# Patient Record
Sex: Female | Born: 1970
Health system: Southern US, Community
[De-identification: ages and names within clinical notes are randomized; demographics above are authoritative.]

## PROBLEM LIST (undated history)

## (undated) DIAGNOSIS — E669 Obesity, unspecified: Secondary | ICD-10-CM

## (undated) DIAGNOSIS — I10 Essential (primary) hypertension: Secondary | ICD-10-CM

## (undated) DIAGNOSIS — E785 Hyperlipidemia, unspecified: Secondary | ICD-10-CM

## (undated) HISTORY — DX: Hyperlipidemia, unspecified: E78.5

## (undated) HISTORY — DX: Obesity, unspecified: E66.9

---

## 2009-12-14 ENCOUNTER — Ambulatory Visit (HOSPITAL_COMMUNITY): Admission: RE | Admit: 2009-12-14 | Discharge: 2009-12-14 | Payer: Self-pay | Admitting: Pulmonary Disease

## 2012-12-14 ENCOUNTER — Telehealth: Payer: Self-pay | Admitting: Nurse Practitioner

## 2012-12-14 ENCOUNTER — Other Ambulatory Visit: Payer: Self-pay | Admitting: Nurse Practitioner

## 2012-12-14 MED ORDER — ATORVASTATIN CALCIUM 40 MG PO TABS: 40.0000 mg | ORAL_TABLET | Freq: Every day | ORAL | Status: DC

## 2012-12-14 NOTE — Progress Notes (Signed)
RX CALLED INTO PHARMACY

## 2012-12-14 NOTE — Telephone Encounter (Signed)
Spoke with pt and medication was called into pharmacy

## 2013-01-15 ENCOUNTER — Encounter: Payer: Self-pay | Admitting: Nurse Practitioner

## 2013-01-15 ENCOUNTER — Ambulatory Visit (INDEPENDENT_AMBULATORY_CARE_PROVIDER_SITE_OTHER): Payer: Managed Care, Other (non HMO) | Admitting: Nurse Practitioner

## 2013-01-15 VITALS — BP 144/88 | HR 78 | Temp 97.0°F | Ht 64.0 in | Wt 302.0 lb

## 2013-01-15 DIAGNOSIS — E785 Hyperlipidemia, unspecified: Secondary | ICD-10-CM

## 2013-01-15 LAB — COMPLETE METABOLIC PANEL WITH GFR
ALT: <8 U/L (ref 0–35)
Albumin: 3.9 g/dL (ref 3.5–5.2)
Alkaline Phosphatase: 91 U/L (ref 39–117)
BUN: 12 mg/dL (ref 6–23)
Calcium: 9.3 mg/dL (ref 8.4–10.5)
GFR, Est Non African American: >89 mL/min
Sodium: 138 mEq/L (ref 135–145)
Total Bilirubin: 0.8 mg/dL (ref 0.3–1.2)
Total Protein: 7.3 g/dL (ref 6.0–8.3)

## 2013-01-15 MED ORDER — PHENTERMINE-TOPIRAMATE ER 3.75-23 MG PO CP24: 1.0000 | ORAL_CAPSULE | Freq: Every day | ORAL | Status: DC

## 2013-01-15 MED ORDER — PHENTERMINE-TOPIRAMATE ER 7.5-46 MG PO CP24: 1.0000 | ORAL_CAPSULE | Freq: Every day | ORAL | Status: DC

## 2013-01-15 MED ORDER — LORCASERIN HCL 10 MG PO TABS: 10.0000 mg | ORAL_TABLET | Freq: Two times a day (BID) | ORAL | Status: DC

## 2013-01-15 NOTE — Patient Instructions (Signed)
Calorie Counting Diet A calorie counting diet requires you to eat the number of calories that are right for you in a day. Calories are the measurement of how much energy you get from the food you eat. Eating the right amount of calories is important for staying at a healthy weight. If you eat too many calories, your body will store them as fat and you may gain weight. If you eat too few calories, you may lose weight. Counting the number of calories you eat during a day will help you know if you are eating the right amount. A Registered Dietitian can determine how many calories you need in a day. The amount of calories needed varies from person to person. If your goal is to lose weight, you will need to eat fewer calories. Losing weight can benefit you if you are overweight or have health problems such as heart disease, high blood pressure, or diabetes. If your goal is to gain weight, you will need to eat more calories. Gaining weight may be necessary if you have a certain health problem that causes your body to need more energy. TIPS Whether you are increasing or decreasing the number of calories you eat during a day, it may be hard to get used to changes in what you eat and drink. The following are tips to help you keep track of the number of calories you eat.  Measure foods at home with measuring cups. This helps you know the amount of food and number of calories you are eating.  Restaurants often serve food in amounts that are larger than 1 serving. While eating out, estimate how many servings of a food you are given. For example, a serving of cooked rice is  cup or about the size of half of a fist. Knowing serving sizes will help you be aware of how much food you are eating at restaurants.  Ask for smaller portion sizes or child-size portions at restaurants.  Plan to eat half of a meal at a restaurant. Take the rest home or share the other half with a friend.  Read the Nutrition Facts panel on  food labels for calorie content and serving size. You can find out how many servings are in a package, the size of a serving, and the number of calories each serving has.  For example, a package might contain 3 cookies. The Nutrition Facts panel on that package says that 1 serving is 1 cookie. Below that, it will say there are 3 servings in the container. The calories section of the Nutrition Facts label says there are 90 calories. This means there are 90 calories in 1 cookie (1 serving). If you eat 1 cookie you have eaten 90 calories. If you eat all 3 cookies, you have eaten 270 calories (3 servings x 90 calories = 270 calories). The list below tells you how big or small some common portion sizes are.  1 oz.........4 stacked dice.  3 oz.........Deck of cards.  1 tsp........Tip of little finger.  1 tbs........Thumb.  2 tbs........Golf ball.   cup.......Half of a fist.  1 cup........A fist. KEEP A FOOD LOG Write down every food item you eat, the amount you eat, and the number of calories in each food you eat during the day. At the end of the day, you can add up the total number of calories you have eaten. It may help to keep a list like the one below. Find out the calorie information by reading the   Nutrition Facts panel on food labels. Breakfast  Bran cereal (1 cup, 110 calories).  Fat-free milk ( cup, 45 calories). Snack  Apple (1 medium, 80 calories). Lunch  Spinach (1 cup, 20 calories).  Tomato ( medium, 20 calories).  Chicken breast strips (3 oz, 165 calories).  Shredded cheddar cheese ( cup, 110 calories).  Light Italian dressing (2 tbs, 60 calories).  Whole-wheat bread (1 slice, 80 calories).  Tub margarine (1 tsp, 35 calories).  Vegetable soup (1 cup, 160 calories). Dinner  Pork chop (3 oz, 190 calories).  Brown rice (1 cup, 215 calories).  Steamed broccoli ( cup, 20 calories).  Strawberries (1  cup, 65 calories).  Whipped cream (1 tbs, 50  calories). Daily Calorie Total: 1425 Document Released: 08/29/2005 Document Revised: 11/21/2011 Document Reviewed: 02/23/2007 [employer] Patient Information 2013 [employer].  

## 2013-01-15 NOTE — Progress Notes (Signed)
  Subjective:    Patient ID: Madison Butler, female    DOB: 1970-10-04, 42 y.o.   MRN: [id number]  Hyperlipidemia This is a chronic problem. The current episode started more than 1 year ago. The problem is uncontrolled. Recent lipid tests were reviewed and are normal. There are no known factors aggravating her hyperlipidemia. Pertinent negatives include no focal weakness, leg pain, myalgias or shortness of breath. She is currently on no antihyperlipidemic treatment. Improvement on treatment: this will be the first time blood work has been done since starting meds. Compliance problems include adherence to diet and adherence to exercise.  Risk factors for coronary artery disease include obesity.  Obesity Patient was given Rx for Qsymia at last visit but was unable to fill. Wants new RX to try.    Review of Systems  Respiratory: Negative for shortness of breath.   Musculoskeletal: Negative for myalgias.  Neurological: Negative for focal weakness.  All other systems reviewed and are negative.       Objective:   Physical Exam  Constitutional: She is oriented to person, place, and time. She appears well-developed and well-nourished.  HENT:  Nose: Nose normal.  Mouth/Throat: Oropharynx is clear and moist.  Eyes: EOM are normal.  Neck: Trachea normal, normal range of motion and full passive range of motion without pain. Neck supple. No JVD present. Carotid bruit is not present. No thyromegaly present.  Cardiovascular: Normal rate, regular rhythm, normal heart sounds and intact distal pulses.  Exam reveals no gallop and no friction rub.   No murmur heard. Pulmonary/Chest: Effort normal and breath sounds normal.  Abdominal: Soft. Bowel sounds are normal. She exhibits no distension and no mass. There is no tenderness.  Musculoskeletal: Normal range of motion.  Lymphadenopathy:    She has no cervical adenopathy.  Neurological: She is alert and oriented to person, place, and time. She has normal  reflexes.  Skin: Skin is warm and dry.  Psychiatric: She has a normal mood and affect. Her behavior is normal. Judgment and thought content normal.    BP 144/88  Pulse 78  Temp(Src) 97 F (36.1 C) (Oral)  Ht 5\' 4"  (1.626 m)  Wt 302 lb (136.986 kg)  BMI 51.81 kg/m2  LMP 01/09/2013      Assessment & Plan:  1. Hyperlipidemia Low fat diet and exercise - COMPLETE METABOLIC PANEL WITH GFR - NMR Lipoprofile with Lipids  2. Morbid obesity Low fat diet and exercise Go to [email or web address] .[email or web address] for more information to help with weight loss - Phentermine-Topiramate 3.75-23 MG CP24; Take 1 capsule by mouth daily.  Dispense: 14 capsule; Refill: 0 - Phentermine-Topiramate (QSYMIA) 7.5-46 MG CP24; Take 1 capsule by mouth daily.  Dispense: 30 capsule; Refill: 1  Mary-Margaret Daphine Deutscher, FNP

## 2013-01-17 LAB — NMR LIPOPROFILE WITH LIPIDS
HDL-C: 51 mg/dL (ref >=40)
LDL (calc): 53 mg/dL (ref <100)
LP-IR Score: 37 (ref <=45)
Large HDL-P: 9.9 umol/L (ref >=4.8)
Large VLDL-P: 2.8 nmol/L — ABNORMAL HIGH (ref <=2.7)

## 2013-03-21 ENCOUNTER — Telehealth: Payer: Self-pay | Admitting: Nurse Practitioner

## 2013-03-21 NOTE — Telephone Encounter (Signed)
appt given fot July 30. Mmm is on vacation the week of July 22

## 2013-04-10 ENCOUNTER — Ambulatory Visit (INDEPENDENT_AMBULATORY_CARE_PROVIDER_SITE_OTHER): Payer: Managed Care, Other (non HMO) | Admitting: Nurse Practitioner

## 2013-04-10 ENCOUNTER — Encounter: Payer: Self-pay | Admitting: Nurse Practitioner

## 2013-04-10 VITALS — BP 133/84 | HR 79 | Temp 98.2°F | Ht 64.0 in | Wt 303.0 lb

## 2013-04-10 DIAGNOSIS — M7730 Calcaneal spur, unspecified foot: Secondary | ICD-10-CM

## 2013-04-10 DIAGNOSIS — R635 Abnormal weight gain: Secondary | ICD-10-CM

## 2013-04-10 MED ORDER — PHENTERMINE-TOPIRAMATE ER 7.5-46 MG PO CP24: 1.0000 | ORAL_CAPSULE | Freq: Every day | ORAL | Status: DC

## 2013-04-10 MED ORDER — NABUMETONE 750 MG PO TABS: 750.0000 mg | ORAL_TABLET | Freq: Two times a day (BID) | ORAL | Status: DC

## 2013-04-10 NOTE — Progress Notes (Signed)
  Subjective:    Patient ID: Madison Butler, female    DOB: 12/20/1970, 42 y.o.   MRN: [id number]  HPI 1.Pt here for f/u from taking Qsymia for weight loss. Pt states she has taken it for two month with 8 lbs weight loss. However, pt states she has gained most of that back, because she hasn't taken RX in last two weeks. Pt states she would like to continue taking RX. Pt denies any s/s of side effects from Qsymia.  2.Heel spur and would like a refill on relafen  Review of Systems  All other systems reviewed and are negative.       Objective:   Physical Exam  Constitutional: She is oriented to person, place, and time. She appears well-developed and well-nourished.  Cardiovascular: Normal rate, regular rhythm, normal heart sounds and intact distal pulses.   Pulmonary/Chest: Effort normal and breath sounds normal.  Abdominal: Soft. Bowel sounds are normal. She exhibits no distension. There is no tenderness.  Musculoskeletal: Normal range of motion.  Neurological: She is alert and oriented to person, place, and time.  Skin: Skin is warm and dry.  Psychiatric: She has a normal mood and affect. Her behavior is normal. Judgment and thought content normal.    BP 133/84  Pulse 79  Temp(Src) 98.2 F (36.8 C) (Oral)  Ht 5\' 4"  (1.626 m)  Wt 303 lb (137.44 kg)  BMI 51.98 kg/m2       Assessment & Plan:  1. Morbid obesity Low fat diet and exercise encouraged Recheck weight in 2 months - Phentermine-Topiramate (QSYMIA) 7.5-46 MG CP24; Take 1 capsule by mouth daily.  Dispense: 30 capsule; Refill: 2  2. Heel spur, unspecified laterality relafen 750mg  1 PO BID  Mary-Margaret Daphine Deutscher, FNP

## 2013-04-10 NOTE — Patient Instructions (Addendum)

## 2013-05-07 ENCOUNTER — Ambulatory Visit: Payer: Managed Care, Other (non HMO) | Admitting: Nurse Practitioner

## 2013-05-29 ENCOUNTER — Other Ambulatory Visit: Payer: Self-pay | Admitting: Nurse Practitioner

## 2013-05-29 NOTE — Telephone Encounter (Signed)
LAST LIPIDS 5/14.

## 2013-09-01 ENCOUNTER — Other Ambulatory Visit: Payer: Self-pay | Admitting: Nurse Practitioner

## 2013-11-26 ENCOUNTER — Ambulatory Visit (INDEPENDENT_AMBULATORY_CARE_PROVIDER_SITE_OTHER): Payer: BC Managed Care – PPO | Admitting: Nurse Practitioner

## 2013-11-26 ENCOUNTER — Encounter: Payer: Self-pay | Admitting: Nurse Practitioner

## 2013-11-26 ENCOUNTER — Telehealth: Payer: Self-pay | Admitting: Nurse Practitioner

## 2013-11-26 VITALS — BP 136/83 | HR 76 | Temp 97.5°F | Ht 64.0 in | Wt 316.6 lb

## 2013-11-26 DIAGNOSIS — Z0000 Encounter for general adult medical examination without abnormal findings: Secondary | ICD-10-CM

## 2013-11-26 DIAGNOSIS — Z23 Encounter for immunization: Secondary | ICD-10-CM

## 2013-11-26 LAB — POCT UA - MICROSCOPIC ONLY
CRYSTALS, UR, HPF, POC: NEGATIVE
Casts, Ur, LPF, POC: NEGATIVE
MUCUS UA: NEGATIVE
WBC, UR, HPF, POC: NEGATIVE
Yeast, UA: NEGATIVE

## 2013-11-26 LAB — POCT URINALYSIS DIPSTICK
Bilirubin, UA: NEGATIVE
Glucose, UA: NEGATIVE
Ketones, UA: NEGATIVE
LEUKOCYTES UA: NEGATIVE
NITRITE UA: NEGATIVE
Protein, UA: NEGATIVE
Spec Grav, UA: 1.01
Urobilinogen, UA: NEGATIVE
pH, UA: 7

## 2013-11-26 MED ORDER — PHENTERMINE-TOPIRAMATE ER 3.75-23 MG PO CP24: 1.0000 | ORAL_CAPSULE | Freq: Every day | ORAL | Status: DC

## 2013-11-26 MED ORDER — PHENTERMINE-TOPIRAMATE ER 7.5-46 MG PO CP24: 1.0000 | ORAL_CAPSULE | Freq: Every day | ORAL | Status: DC

## 2013-11-26 NOTE — Progress Notes (Signed)
Subjective:    Patient ID: Madison Butler, female    DOB: Sep 18, 1970, 43 y.o.   MRN: [id number]  HPI Patient presents today for annual exam. Patient is interested in losing weight. Not currently dieting or exercising. Has used Qsymia in the past and loss some but was not disciplined. Does have a plan in place if she were to start medication again. Dentist appt scheduled for May and Eye exam scheduled for April.    Review of Systems  Constitutional: Positive for fatigue.  Gastrointestinal: Negative for diarrhea and constipation.  Genitourinary: Negative for dysuria.  Neurological: Negative for headaches.  Psychiatric/Behavioral: Negative for self-injury.       Objective:   Physical Exam  Constitutional: She is oriented to person, place, and time. She appears well-developed and well-nourished.  HENT:  Head: Normocephalic.  Right Ear: External ear normal.  Left Ear: External ear normal.  Mouth/Throat: Oropharynx is clear and moist.  Eyes: Pupils are equal, round, and reactive to light.  Neck: Normal range of motion. Neck supple.  Cardiovascular: Normal rate, regular rhythm and normal heart sounds.  Exam reveals no gallop and no friction rub.   No murmur heard. Pulmonary/Chest: Effort normal and breath sounds normal.  Abdominal: Bowel sounds are normal.  Musculoskeletal: Normal range of motion.  Neurological: She is alert and oriented to person, place, and time.  Skin: Skin is warm and dry.  Psychiatric: She has a normal mood and affect. Her behavior is normal. Judgment and thought content normal.   BP 136/83  Pulse 76  Temp(Src) 97.5 F (36.4 C) (Oral)  Ht $R'5\' 4"'ar$  (1.626 m)  Wt 316 lb 9.6 oz (143.609 kg)  BMI 54.32 kg/m2  Results for orders placed in visit on 11/26/13  POCT URINALYSIS DIPSTICK      Result Value Ref Range   Color, UA YELLOW     Clarity, UA CLEAR     Glucose, UA NEG     Bilirubin, UA NEG     Ketones, UA NEG     Spec Grav, UA 1.010     Blood, UA TRACE       pH, UA 7.0     Protein, UA NEG     Urobilinogen, UA negative     Nitrite, UA NEG     Leukocytes, UA Negative    POCT UA - MICROSCOPIC ONLY      Result Value Ref Range   WBC, Ur, HPF, POC NEG     RBC, urine, microscopic 5-10     Bacteria, U Microscopic RARE     Mucus, UA NEG     Epithelial cells, urine per micros OCC     Crystals, Ur, HPF, POC NEG     Casts, Ur, LPF, POC NEG     Yeast, UA NEG            Assessment & Plan:   1. Annual physical exam   2. Morbid obesity    Orders Placed This Encounter  Procedures  . CMP14+EGFR  . NMR, lipoprofile  . TSH  . POCT urinalysis dipstick  . POCT UA - Microscopic Only   Meds ordered this encounter  Medications  . Phentermine-Topiramate (QSYMIA) 3.75-23 MG CP24    Sig: Take 1 tablet by mouth daily.    Dispense:  14 capsule    Refill:  0    Order Specific Question:  Supervising Provider    Answer:  Chipper Herb [[id number]]  . Phentermine-Topiramate (QSYMIA) 7.5-46 MG CP24  Sig: Take 1 capsule by mouth daily.    Dispense:  30 capsule    Refill:  2    Order Specific Question:  Supervising Provider    Answer:  Chipper Herb [[id number]]    Follow diet and exercise plan in addition to Qsymia Followup in 1 month for weight check  Mary-Margaret Hassell Done, FNP

## 2013-11-26 NOTE — Patient Instructions (Signed)
Calorie Counting Diet A calorie counting diet requires you to eat the number of calories that are right for you in a day. Calories are the measurement of how much energy you get from the food you eat. Eating the right amount of calories is important for staying at a healthy weight. If you eat too many calories, your body will store them as fat and you may gain weight. If you eat too few calories, you may lose weight. Counting the number of calories you eat during a day will help you know if you are eating the right amount. A Registered Dietitian can determine how many calories you need in a day. The amount of calories needed varies from person to person. If your goal is to lose weight, you will need to eat fewer calories. Losing weight can benefit you if you are overweight or have health problems such as heart disease, high blood pressure, or diabetes. If your goal is to gain weight, you will need to eat more calories. Gaining weight may be necessary if you have a certain health problem that causes your body to need more energy. TIPS Whether you are increasing or decreasing the number of calories you eat during a day, it may be hard to get used to changes in what you eat and drink. The following are tips to help you keep track of the number of calories you eat.  Measure foods at home with measuring cups. This helps you know the amount of food and number of calories you are eating.  Restaurants often serve food in amounts that are larger than 1 serving. While eating out, estimate how many servings of a food you are given. For example, a serving of cooked rice is  cup or about the size of half of a fist. Knowing serving sizes will help you be aware of how much food you are eating at restaurants.  Ask for smaller portion sizes or child-size portions at restaurants.  Plan to eat half of a meal at a restaurant. Take the rest home or share the other half with a friend.  Read the Nutrition Facts panel on  food labels for calorie content and serving size. You can find out how many servings are in a package, the size of a serving, and the number of calories each serving has.  For example, a package might contain 3 cookies. The Nutrition Facts panel on that package says that 1 serving is 1 cookie. Below that, it will say there are 3 servings in the container. The calories section of the Nutrition Facts label says there are 90 calories. This means there are 90 calories in 1 cookie (1 serving). If you eat 1 cookie you have eaten 90 calories. If you eat all 3 cookies, you have eaten 270 calories (3 servings x 90 calories = 270 calories). The list below tells you how big or small some common portion sizes are.  1 oz.........4 stacked dice.  3 oz.........Deck of cards.  1 tsp........Tip of little finger.  1 tbs........Thumb.  2 tbs........Golf ball.   cup.......Half of a fist.  1 cup........A fist. KEEP A FOOD LOG Write down every food item you eat, the amount you eat, and the number of calories in each food you eat during the day. At the end of the day, you can add up the total number of calories you have eaten. It may help to keep a list like the one below. Find out the calorie information by reading the   Nutrition Facts panel on food labels. Breakfast  Bran cereal (1 cup, 110 calories).  Fat-free milk ( cup, 45 calories). Snack  Apple (1 medium, 80 calories). Lunch  Spinach (1 cup, 20 calories).  Tomato ( medium, 20 calories).  Chicken breast strips (3 oz, 165 calories).  Shredded cheddar cheese ( cup, 110 calories).  Light Italian dressing (2 tbs, 60 calories).  Whole-wheat bread (1 slice, 80 calories).  Tub margarine (1 tsp, 35 calories).  Vegetable soup (1 cup, 160 calories). Dinner  Pork chop (3 oz, 190 calories).  Brown rice (1 cup, 215 calories).  Steamed broccoli ( cup, 20 calories).  Strawberries (1  cup, 65 calories).  Whipped cream (1 tbs, 50  calories). Daily Calorie Total: 1425 Document Released: 08/29/2005 Document Revised: 11/21/2011 Document Reviewed: 02/23/2007 [employer] Patient Information 2014 [employer].  

## 2013-11-28 LAB — CMP14+EGFR
A/G RATIO: 1.3 (ref 1.1–2.5)
ALBUMIN: 3.8 g/dL (ref 3.5–5.5)
ALK PHOS: 100 IU/L (ref 39–117)
ALT: 15 IU/L (ref 0–32)
AST: 15 IU/L (ref 0–40)
BUN / CREAT RATIO: 16 (ref 9–23)
BUN: 11 mg/dL (ref 6–24)
CALCIUM: 9.4 mg/dL (ref 8.7–10.2)
CHLORIDE: 100 mmol/L (ref 97–108)
CO2: 23 mmol/L (ref 18–29)
CREATININE: 0.7 mg/dL (ref 0.57–1.00)
GFR, EST AFRICAN AMERICAN: 124 mL/min/{1.73_m2} (ref >59)
GFR, EST NON AFRICAN AMERICAN: 107 mL/min/{1.73_m2} (ref >59)
GLOBULIN, TOTAL: 2.9 g/dL (ref 1.5–4.5)
Glucose: 80 mg/dL (ref 65–99)
POTASSIUM: 5.1 mmol/L (ref 3.5–5.2)
Sodium: 138 mmol/L (ref 134–144)
TOTAL PROTEIN: 6.7 g/dL (ref 6.0–8.5)
Total Bilirubin: 0.6 mg/dL (ref 0.0–1.2)

## 2013-11-28 LAB — NMR, LIPOPROFILE
Cholesterol: 130 mg/dL (ref <200)
HDL CHOLESTEROL BY NMR: 51 mg/dL (ref >=40)
HDL Particle Number: 33.5 umol/L (ref >=30.5)
LDL PARTICLE NUMBER: 935 nmol/L (ref <1000)
LDL SIZE: 19.9 nm — AB (ref >20.5)
LDLC SERPL CALC-MCNC: 55 mg/dL (ref <100)
LP-IR Score: <25 (ref <=45)
Small LDL Particle Number: 701 nmol/L — ABNORMAL HIGH (ref <=527)
Triglycerides by NMR: 121 mg/dL (ref <150)

## 2013-11-28 LAB — TSH: TSH: 2.82 u[IU]/mL (ref 0.450–4.500)

## 2013-12-05 ENCOUNTER — Other Ambulatory Visit: Payer: Self-pay | Admitting: *Deleted

## 2013-12-05 MED ORDER — ATORVASTATIN CALCIUM 40 MG PO TABS: ORAL_TABLET | ORAL | Status: DC

## 2013-12-11 ENCOUNTER — Telehealth: Payer: Self-pay | Admitting: Nurse Practitioner

## 2013-12-11 NOTE — Telephone Encounter (Signed)
Patient has 2 refills on rx i wrote in march

## 2013-12-16 ENCOUNTER — Other Ambulatory Visit: Payer: Self-pay | Admitting: *Deleted

## 2013-12-16 NOTE — Telephone Encounter (Signed)
Has to get prio authorization every 10 days for insurance so it needs prio aruthrizations

## 2013-12-16 NOTE — Telephone Encounter (Signed)
Madison Butler please check into this

## 2013-12-17 ENCOUNTER — Telehealth: Payer: Self-pay | Admitting: *Deleted

## 2013-12-17 NOTE — Telephone Encounter (Signed)
This lady does not need a prior authorization she needs and new script, as that one was only 14 days, can you take care of this for her?  Thanks

## 2013-12-17 NOTE — Telephone Encounter (Signed)
Being worked on by Marlynn Perking. Lawson

## 2013-12-17 NOTE — Telephone Encounter (Signed)
Printed this and gave to Hughes SupplyDonna lawson

## 2013-12-27 NOTE — Telephone Encounter (Signed)
Called Gavin PoundDeborah, unable to reach on telephone.  Called pharmacy and they said Gavin PoundDeborah got script filled for 30 on December 18, 2013.

## 2013-12-31 ENCOUNTER — Ambulatory Visit: Payer: BC Managed Care – PPO | Admitting: Nurse Practitioner

## 2014-01-01 NOTE — Telephone Encounter (Signed)
         Could not get pt on telephone, but called pharmacy and  She picked up her med so she must have found her script.

## 2014-01-27 ENCOUNTER — Ambulatory Visit (INDEPENDENT_AMBULATORY_CARE_PROVIDER_SITE_OTHER): Payer: BC Managed Care – PPO | Admitting: Nurse Practitioner

## 2014-01-27 ENCOUNTER — Encounter: Payer: Self-pay | Admitting: Nurse Practitioner

## 2014-01-27 DIAGNOSIS — R635 Abnormal weight gain: Secondary | ICD-10-CM

## 2014-01-27 MED ORDER — PHENTERMINE-TOPIRAMATE ER 7.5-46 MG PO CP24: 1.0000 | ORAL_CAPSULE | Freq: Every day | ORAL | Status: DC

## 2014-01-27 NOTE — Progress Notes (Signed)
   Subjective:    Patient ID: Madison Butler, female    DOB: 23-Feb-1971, 43 y.o.   MRN: [id number]  HPI Patient here today for 1  Month follow up- Morbid obesity and patient was interested in going back on qsymia- SHe is here today for weight check. Since last visit she has been watching her diet with only a little exercise because she has ben getting cortisone shots in feet and she was told not to walk a lot.    Review of Systems  Constitutional: Negative.   HENT: Negative.   Respiratory: Negative.   Cardiovascular: Negative.   Genitourinary: Negative.   Hematological: Negative.   Psychiatric/Behavioral: Negative.   All other systems reviewed and are negative.      Objective:   Physical Exam  Constitutional: She is oriented to person, place, and time. She appears well-developed and well-nourished.  Weight down 7 lbs   Cardiovascular: Normal rate and normal heart sounds.   Pulmonary/Chest: Effort normal and breath sounds normal.  Neurological: She is alert and oriented to person, place, and time.  Skin: Skin is warm and dry.  Psychiatric: She has a normal mood and affect. Her behavior is normal. Judgment and thought content normal.    BP 126/86  Pulse 94  Temp(Src) 98.9 F (37.2 C) (Oral)  Ht 5\' 4"  (1.626 m)  Wt 309 lb (140.161 kg)  BMI 53.01 kg/m2       Assessment & Plan:   1. Morbid obesity    Meds ordered this encounter  Medications  . Phentermine-Topiramate (QSYMIA) 7.5-46 MG CP24    Sig: Take 1 capsule by mouth daily.    Dispense:  30 capsule    Refill:  2    Order Specific Question:  Supervising Provider    Answer:  Ernestina PennaMOORE, DONALD W [[id number]]   Low fat diet Encouraged to try water aerobics Follow up in 1-2 months  Madison Daphine DeutscherMartin, FNP

## 2014-01-27 NOTE — Patient Instructions (Signed)
Fat and Cholesterol Control Diet  Fat and cholesterol levels in your blood and organs are influenced by your diet. High levels of fat and cholesterol may lead to diseases of the heart, small and large blood vessels, gallbladder, liver, and pancreas.  CONTROLLING FAT AND CHOLESTEROL WITH DIET  Although exercise and lifestyle factors are important, your diet is key. That is because certain foods are known to raise cholesterol and others to lower it. The goal is to balance foods for their effect on cholesterol and more importantly, to replace saturated and trans fat with other types of fat, such as monounsaturated fat, polyunsaturated fat, and omega-3 fatty acids.  On average, a person should consume no more than 15 to 17 g of saturated fat daily. Saturated and trans fats are considered "bad" fats, and they will raise LDL cholesterol. Saturated fats are primarily found in animal products such as meats, butter, and cream. However, that does not mean you need to give up all your favorite foods. Today, there are good tasting, low-fat, low-cholesterol substitutes for most of the things you like to eat. Choose low-fat or nonfat alternatives. Choose round or loin cuts of red meat. These types of cuts are lowest in fat and cholesterol. Chicken (without the skin), fish, veal, and ground turkey breast are great choices. Eliminate fatty meats, such as hot dogs and salami. Even shellfish have little or no saturated fat. Have a 3 oz (85 g) portion when you eat lean meat, poultry, or fish.  Trans fats are also called "partially hydrogenated oils." They are oils that have been scientifically manipulated so that they are solid at room temperature resulting in a longer shelf life and improved taste and texture of foods in which they are added. Trans fats are found in stick margarine, some tub margarines, cookies, crackers, and baked goods.   When baking and cooking, oils are a great substitute for butter. The monounsaturated oils are  especially beneficial since it is believed they lower LDL and raise HDL. The oils you should avoid entirely are saturated tropical oils, such as coconut and palm.   Remember to eat a lot from food groups that are naturally free of saturated and trans fat, including fish, fruit, vegetables, beans, grains (barley, rice, couscous, bulgur wheat), and pasta (without cream sauces).   IDENTIFYING FOODS THAT LOWER FAT AND CHOLESTEROL   Soluble fiber may lower your cholesterol. This type of fiber is found in fruits such as apples, vegetables such as broccoli, potatoes, and carrots, legumes such as beans, peas, and lentils, and grains such as barley. Foods fortified with plant sterols (phytosterol) may also lower cholesterol. You should eat at least 2 g per day of these foods for a cholesterol lowering effect.   Read package labels to identify low-saturated fats, trans fat free, and low-fat foods at the supermarket. Select cheeses that have only 2 to 3 g saturated fat per ounce. Use a heart-healthy tub margarine that is free of trans fats or partially hydrogenated oil. When buying baked goods (cookies, crackers), avoid partially hydrogenated oils. Breads and muffins should be made from whole grains (whole-wheat or whole oat flour, instead of "flour" or "enriched flour"). Buy non-creamy canned soups with reduced salt and no added fats.   FOOD PREPARATION TECHNIQUES   Never deep-fry. If you must fry, either stir-fry, which uses very little fat, or use non-stick cooking sprays. When possible, broil, bake, or roast meats, and steam vegetables. Instead of putting butter or margarine on vegetables, use lemon   and herbs, applesauce, and cinnamon (for squash and sweet potatoes). Use nonfat yogurt, salsa, and low-fat dressings for salads.   LOW-SATURATED FAT / LOW-FAT FOOD SUBSTITUTES  Meats / Saturated Fat (g)  · Avoid: Steak, marbled (3 oz/85 g) / 11 g  · Choose: Steak, lean (3 oz/85 g) / 4 g  · Avoid: Hamburger (3 oz/85 g) / 7  g  · Choose: Hamburger, lean (3 oz/85 g) / 5 g  · Avoid: Ham (3 oz/85 g) / 6 g  · Choose: Ham, lean cut (3 oz/85 g) / 2.4 g  · Avoid: Chicken, with skin, dark meat (3 oz/85 g) / 4 g  · Choose: Chicken, skin removed, dark meat (3 oz/85 g) / 2 g  · Avoid: Chicken, with skin, light meat (3 oz/85 g) / 2.5 g  · Choose: Chicken, skin removed, light meat (3 oz/85 g) / 1 g  Dairy / Saturated Fat (g)  · Avoid: Whole milk (1 cup) / 5 g  · Choose: Low-fat milk, 2% (1 cup) / 3 g  · Choose: Low-fat milk, 1% (1 cup) / 1.5 g  · Choose: Skim milk (1 cup) / 0.3 g  · Avoid: Hard cheese (1 oz/28 g) / 6 g  · Choose: Skim milk cheese (1 oz/28 g) / 2 to 3 g  · Avoid: Cottage cheese, 4% fat (1 cup) / 6.5 g  · Choose: Low-fat cottage cheese, 1% fat (1 cup) / 1.5 g  · Avoid: Ice cream (1 cup) / 9 g  · Choose: Sherbet (1 cup) / 2.5 g  · Choose: Nonfat frozen yogurt (1 cup) / 0.3 g  · Choose: Frozen fruit bar / trace  · Avoid: Whipped cream (1 tbs) / 3.5 g  · Choose: Nondairy whipped topping (1 tbs) / 1 g  Condiments / Saturated Fat (g)  · Avoid: Mayonnaise (1 tbs) / 2 g  · Choose: Low-fat mayonnaise (1 tbs) / 1 g  · Avoid: Butter (1 tbs) / 7 g  · Choose: Extra light margarine (1 tbs) / 1 g  · Avoid: Coconut oil (1 tbs) / 11.8 g  · Choose: Olive oil (1 tbs) / 1.8 g  · Choose: Corn oil (1 tbs) / 1.7 g  · Choose: Safflower oil (1 tbs) / 1.2 g  · Choose: Sunflower oil (1 tbs) / 1.4 g  · Choose: Soybean oil (1 tbs) / 2.4 g  · Choose: Canola oil (1 tbs) / 1 g  Document Released: 08/29/2005 Document Revised: 12/24/2012 Document Reviewed: 02/17/2011  ExitCare® Patient Information ©2014 [employer].

## 2014-03-07 ENCOUNTER — Other Ambulatory Visit: Payer: Self-pay | Admitting: Nurse Practitioner

## 2014-06-08 ENCOUNTER — Other Ambulatory Visit: Payer: Self-pay | Admitting: Nurse Practitioner

## 2014-06-18 ENCOUNTER — Other Ambulatory Visit: Payer: Self-pay | Admitting: Nurse Practitioner

## 2014-06-20 ENCOUNTER — Telehealth: Payer: Self-pay | Admitting: Family Medicine

## 2014-06-20 NOTE — Telephone Encounter (Signed)
Left message on patient's voicemail.

## 2014-06-20 NOTE — Telephone Encounter (Signed)
Already addressed- NTBS for weight check to get new rx

## 2014-06-20 NOTE — Telephone Encounter (Signed)
Left message on voicemail that she needs to schedule an appointment.

## 2014-06-20 NOTE — Telephone Encounter (Signed)
Has to be seen to check weight in order to get refill

## 2014-06-20 NOTE — Telephone Encounter (Signed)
Last seen 01/27/14

## 2014-06-26 ENCOUNTER — Ambulatory Visit: Payer: BC Managed Care – PPO | Admitting: Nurse Practitioner

## 2014-06-30 ENCOUNTER — Ambulatory Visit: Payer: BC Managed Care – PPO | Admitting: Nurse Practitioner

## 2014-07-17 ENCOUNTER — Ambulatory Visit (INDEPENDENT_AMBULATORY_CARE_PROVIDER_SITE_OTHER): Payer: BC Managed Care – PPO | Admitting: Nurse Practitioner

## 2014-07-17 ENCOUNTER — Encounter: Payer: Self-pay | Admitting: Nurse Practitioner

## 2014-07-17 ENCOUNTER — Encounter (INDEPENDENT_AMBULATORY_CARE_PROVIDER_SITE_OTHER): Payer: Self-pay

## 2014-07-17 VITALS — BP 142/90 | HR 89 | Temp 98.3°F | Ht 64.0 in | Wt 302.4 lb

## 2014-07-17 DIAGNOSIS — E785 Hyperlipidemia, unspecified: Secondary | ICD-10-CM

## 2014-07-17 DIAGNOSIS — R635 Abnormal weight gain: Secondary | ICD-10-CM

## 2014-07-17 MED ORDER — ATORVASTATIN CALCIUM 40 MG PO TABS: ORAL_TABLET | ORAL | Status: DC

## 2014-07-17 MED ORDER — PHENTERMINE-TOPIRAMATE ER 7.5-46 MG PO CP24: 1.0000 | ORAL_CAPSULE | Freq: Every day | ORAL | Status: DC

## 2014-07-17 NOTE — Progress Notes (Signed)
   Subjective:    Patient ID: Madison Butler, female    DOB: Jun 06, 1971, 43 y.o.   MRN: [id number]   Patient here today for follow up of chronic medical problems.  Hyperlipidemia This is a chronic problem. The current episode started more than 1 year ago. The problem is uncontrolled. Recent lipid tests were reviewed and are high. Exacerbating diseases include obesity. She has no history of diabetes or hypothyroidism. Current antihyperlipidemic treatment includes statins. The current treatment provides mild improvement of lipids. Compliance problems include adherence to diet and adherence to exercise.  Risk factors for coronary artery disease include obesity and dyslipidemia.  obesity Patient has been on qsymia for weight loss but did not really help. Weight is only down 7 lbs.   Review of Systems  Constitutional: Negative.   HENT: Negative.   Respiratory: Negative.   Cardiovascular: Negative.   Genitourinary: Negative.   Neurological: Negative.   Psychiatric/Behavioral: Negative.   All other systems reviewed and are negative.      Objective:   Physical Exam  Constitutional: She is oriented to person, place, and time. She appears well-developed and well-nourished.  HENT:  Nose: Nose normal.  Mouth/Throat: Oropharynx is clear and moist.  Eyes: EOM are normal.  Neck: Trachea normal, normal range of motion and full passive range of motion without pain. Neck supple. No JVD present. Carotid bruit is not present. No thyromegaly present.  Cardiovascular: Normal rate, regular rhythm, normal heart sounds and intact distal pulses.  Exam reveals no gallop and no friction rub.   No murmur heard. Pulmonary/Chest: Effort normal and breath sounds normal.  Abdominal: Soft. Bowel sounds are normal. She exhibits no distension and no mass. There is no tenderness.  Musculoskeletal: Normal range of motion.  Lymphadenopathy:    She has no cervical adenopathy.  Neurological: She is alert and oriented to  person, place, and time. She has normal reflexes.  Skin: Skin is warm and dry.  Psychiatric: She has a normal mood and affect. Her behavior is normal. Judgment and thought content normal.   BP 142/90 mmHg  Pulse 89  Temp(Src) 98.3 F (36.8 C) (Oral)  Ht $R'5\' 4"'zY$  (1.626 m)  Wt 302 lb 6.4 oz (137.168 kg)  BMI 51.88 kg/m2  LMP         Assessment & Plan:  1. Hyperlipidemia Low fat diet - atorvastatin (LIPITOR) 40 MG tablet; TAKE 1 TABLET BY MOUTH EVERY DAY  Dispense: 90 tablet; Refill: 1 - CMP14+EGFR - NMR, lipoprofile  2. Morbid obesity Back on weight watchers - Phentermine-Topiramate (QSYMIA) 7.5-46 MG CP24; Take 1 capsule by mouth daily.  Dispense: 30 capsule; Refill: 2    Labs pending Health maintenance reviewed Diet and exercise encouraged Continue all meds Follow up  In 6 months   [place], FNP

## 2014-07-17 NOTE — Patient Instructions (Signed)

## 2014-07-18 LAB — NMR, LIPOPROFILE
Cholesterol: 136 mg/dL (ref 100–199)
HDL CHOLESTEROL BY NMR: 59 mg/dL (ref >39)
HDL PARTICLE NUMBER: 31.3 umol/L (ref >=30.5)
LDL PARTICLE NUMBER: 802 nmol/L (ref <1000)
LDL SIZE: 20.3 nm (ref >20.5)
LDL-C: 60 mg/dL (ref 0–99)
Small LDL Particle Number: 469 nmol/L (ref <=527)
Triglycerides by NMR: 86 mg/dL (ref 0–149)

## 2014-07-18 LAB — CMP14+EGFR
ALBUMIN: 3.7 g/dL (ref 3.5–5.5)
ALT: 47 IU/L — AB (ref 0–32)
AST: 35 IU/L (ref 0–40)
Albumin/Globulin Ratio: 1.1 (ref 1.1–2.5)
Alkaline Phosphatase: 97 IU/L (ref 39–117)
BUN / CREAT RATIO: 19 (ref 9–23)
BUN: 12 mg/dL (ref 6–24)
CHLORIDE: 99 mmol/L (ref 97–108)
CO2: 25 mmol/L (ref 18–29)
Calcium: 9.1 mg/dL (ref 8.7–10.2)
Creatinine, Ser: 0.62 mg/dL (ref 0.57–1.00)
GFR calc non Af Amer: 112 mL/min/{1.73_m2} (ref >59)
GFR, EST AFRICAN AMERICAN: 129 mL/min/{1.73_m2} (ref >59)
GLOBULIN, TOTAL: 3.3 g/dL (ref 1.5–4.5)
Glucose: 95 mg/dL (ref 65–99)
POTASSIUM: 4.6 mmol/L (ref 3.5–5.2)
Sodium: 140 mmol/L (ref 134–144)
Total Bilirubin: 0.4 mg/dL (ref 0.0–1.2)
Total Protein: 7 g/dL (ref 6.0–8.5)

## 2014-12-26 ENCOUNTER — Other Ambulatory Visit: Payer: Self-pay | Admitting: Nurse Practitioner

## 2014-12-26 DIAGNOSIS — E785 Hyperlipidemia, unspecified: Secondary | ICD-10-CM

## 2014-12-26 MED ORDER — NABUMETONE 750 MG PO TABS: 750.0000 mg | ORAL_TABLET | Freq: Two times a day (BID) | ORAL | Status: DC

## 2014-12-26 MED ORDER — ATORVASTATIN CALCIUM 40 MG PO TABS
ORAL_TABLET | ORAL | Status: DC
Start: 2014-12-26 — End: 2015-03-22

## 2014-12-26 NOTE — Telephone Encounter (Signed)
done

## 2015-03-20 ENCOUNTER — Encounter: Payer: Self-pay | Admitting: *Deleted

## 2015-03-22 ENCOUNTER — Other Ambulatory Visit: Payer: Self-pay | Admitting: Nurse Practitioner

## 2015-04-21 ENCOUNTER — Encounter: Payer: Self-pay | Admitting: Nurse Practitioner

## 2015-05-28 ENCOUNTER — Encounter: Payer: Self-pay | Admitting: Nurse Practitioner

## 2015-05-28 ENCOUNTER — Ambulatory Visit (INDEPENDENT_AMBULATORY_CARE_PROVIDER_SITE_OTHER): Payer: BLUE CROSS/BLUE SHIELD | Admitting: Nurse Practitioner

## 2015-05-28 ENCOUNTER — Encounter (INDEPENDENT_AMBULATORY_CARE_PROVIDER_SITE_OTHER): Payer: Self-pay

## 2015-05-28 VITALS — BP 128/81 | HR 75 | Temp 97.1°F | Ht 64.0 in | Wt 334.0 lb

## 2015-05-28 DIAGNOSIS — M7730 Calcaneal spur, unspecified foot: Secondary | ICD-10-CM | POA: Diagnosis not present

## 2015-05-28 DIAGNOSIS — R609 Edema, unspecified: Secondary | ICD-10-CM

## 2015-05-28 DIAGNOSIS — Z0000 Encounter for general adult medical examination without abnormal findings: Secondary | ICD-10-CM | POA: Diagnosis not present

## 2015-05-28 DIAGNOSIS — E785 Hyperlipidemia, unspecified: Secondary | ICD-10-CM | POA: Diagnosis not present

## 2015-05-28 DIAGNOSIS — R600 Localized edema: Secondary | ICD-10-CM

## 2015-05-28 MED ORDER — PHENTERMINE HCL 37.5 MG PO TABS: 37.5000 mg | ORAL_TABLET | Freq: Every day | ORAL | Status: DC

## 2015-05-28 MED ORDER — NABUMETONE 750 MG PO TABS
750.0000 mg | ORAL_TABLET | Freq: Two times a day (BID) | ORAL | Status: DC
Start: 2015-05-28 — End: 2015-08-25

## 2015-05-28 MED ORDER — ATORVASTATIN CALCIUM 40 MG PO TABS: 40.0000 mg | ORAL_TABLET | Freq: Every day | ORAL | Status: DC

## 2015-05-28 MED ORDER — FUROSEMIDE 20 MG PO TABS: 20.0000 mg | ORAL_TABLET | Freq: Every day | ORAL | Status: DC | PRN

## 2015-05-28 NOTE — Progress Notes (Signed)
Subjective:    Patient ID: Madison Butler, female    DOB: 06-18-1971, 44 y.o.   MRN: [id number]   Patient here today for complete physical exam no PAP. SHe is doing well without complaints, other tehn she has noticed slight lower ext edema in the last 2 months.  Hyperlipidemia This is a chronic problem. The current episode started more than 1 year ago. The problem is uncontrolled. Recent lipid tests were reviewed and are high. Exacerbating diseases include obesity. She has no history of diabetes or hypothyroidism. Current antihyperlipidemic treatment includes statins. The current treatment provides mild improvement of lipids. Compliance problems include adherence to diet and adherence to exercise.  Risk factors for coronary artery disease include obesity and dyslipidemia.  obesity Patient has been on qsymia for weight loss but did not really help. Weight is only down 7 lbs. Heel spurs relafen makes pain bearable.     Review of Systems  Constitutional: Negative.   HENT: Negative.   Respiratory: Negative.   Cardiovascular: Negative.   Genitourinary: Negative.   Neurological: Negative.   Psychiatric/Behavioral: Negative.   All other systems reviewed and are negative.      Objective:   Physical Exam  Constitutional: She is oriented to person, place, and time. She appears well-developed and well-nourished.  HENT:  Nose: Nose normal.  Mouth/Throat: Oropharynx is clear and moist.  Eyes: EOM are normal.  Neck: Trachea normal, normal range of motion and full passive range of motion without pain. Neck supple. No JVD present. Carotid bruit is not present. No thyromegaly present.  Cardiovascular: Normal rate, regular rhythm, normal heart sounds and intact distal pulses.  Exam reveals no gallop and no friction rub.   No murmur heard. Pulmonary/Chest: Effort normal and breath sounds normal.  Abdominal: Soft. Bowel sounds are normal. She exhibits no distension and no mass. There is no  tenderness.  Musculoskeletal: Normal range of motion.  Lymphadenopathy:    She has no cervical adenopathy.  Neurological: She is alert and oriented to person, place, and time. She has normal reflexes.  Skin: Skin is warm and dry.  Psychiatric: She has a normal mood and affect. Her behavior is normal. Judgment and thought content normal.   BP 128/81 mmHg  Pulse 75  Temp(Src) 97.1 F (36.2 C) (Oral)  Ht _0  (1.626 m)  Wt 334 lb (151.501 kg)  BMI 57.30 kg/m2        Assessment & Plan:  1. Annual physical exam - CBC with Differential/Platelet - Thyroid Panel With TSH - Vit D  25 hydroxy (rtn osteoporosis monitoring)  2. Hyperlipidemia Low fat diet - CMP14+EGFR - Lipid panel - atorvastatin (LIPITOR) 40 MG tablet; Take 1 tablet (40 mg total) by mouth daily.  Dispense: 90 tablet; Refill: 1  3. Morbid obesity Discussed diet and exercise for person with BMI >25 Will recheck weight in 3-6 months Encouraged weight watchers - phentermine (ADIPEX-P) 37.5 MG tablet; Take 1 tablet (37.5 mg total) by mouth daily before breakfast.  Dispense: 30 tablet; Refill: 3  4. Heel spur, unspecified laterality - nabumetone (RELAFEN) 750 MG tablet; Take 1 tablet (750 mg total) by mouth 2 (two) times daily.  Dispense: 180 tablet; Refill: 0  5. Peripheral edema Peripheral edema - furosemide (LASIX) 20 MG tablet; Take 1 tablet (20 mg total) by mouth daily as needed.  Dispense: 30 tablet; Refill: 3    Labs pending Health maintenance reviewed Diet and exercise encouraged Continue all meds Follow up  In 3 month  Mary-Margaret Hassell Done, FNP

## 2015-05-28 NOTE — Patient Instructions (Signed)
Exercise to Stay Healthy Exercise helps you become and stay healthy. EXERCISE IDEAS AND TIPS Choose exercises that:  You enjoy.  Fit into your day. You do not need to exercise really hard to be healthy. You can do exercises at a slow or medium level and stay healthy. You can:  Stretch before and after working out.  Try yoga, Pilates, or tai chi.  Lift weights.  Walk fast, swim, jog, run, climb stairs, bicycle, dance, or rollerskate.  Take aerobic classes. Exercises that burn about 150 calories:  Running 1  miles in 15 minutes.  Playing volleyball for 45 to 60 minutes.  Washing and waxing a car for 45 to 60 minutes.  Playing touch football for 45 minutes.  Walking 1  miles in 35 minutes.  Pushing a stroller 1  miles in 30 minutes.  Playing basketball for 30 minutes.  Raking leaves for 30 minutes.  Bicycling 5 miles in 30 minutes.  Walking 2 miles in 30 minutes.  Dancing for 30 minutes.  Shoveling snow for 15 minutes.  Swimming laps for 20 minutes.  Walking up stairs for 15 minutes.  Bicycling 4 miles in 15 minutes.  Gardening for 30 to 45 minutes.  Jumping rope for 15 minutes.  Washing windows or floors for 45 to 60 minutes. Document Released: 10/01/2010 Document Revised: 11/21/2011 Document Reviewed: 10/01/2010 ExitCare Patient Information 2015 [employer]. This information is not intended to replace advice given to you by your health care provider. Make sure you discuss any questions you have with your health care provider.  

## 2015-05-29 LAB — CMP14+EGFR
A/G RATIO: 1.1 (ref 1.1–2.5)
ALBUMIN: 3.6 g/dL (ref 3.5–5.5)
ALK PHOS: 88 IU/L (ref 39–117)
ALT: 132 IU/L — ABNORMAL HIGH (ref 0–32)
AST: 131 IU/L — ABNORMAL HIGH (ref 0–40)
BILIRUBIN TOTAL: 0.8 mg/dL (ref 0.0–1.2)
BUN / CREAT RATIO: 19 (ref 9–23)
BUN: 12 mg/dL (ref 6–24)
CHLORIDE: 101 mmol/L (ref 97–108)
CO2: 23 mmol/L (ref 18–29)
Calcium: 8.8 mg/dL (ref 8.7–10.2)
Creatinine, Ser: 0.62 mg/dL (ref 0.57–1.00)
GFR calc non Af Amer: 111 mL/min/{1.73_m2} (ref >59)
GFR, EST AFRICAN AMERICAN: 128 mL/min/{1.73_m2} (ref >59)
GLOBULIN, TOTAL: 3.4 g/dL (ref 1.5–4.5)
Glucose: 82 mg/dL (ref 65–99)
Potassium: 4.5 mmol/L (ref 3.5–5.2)
SODIUM: 139 mmol/L (ref 134–144)
TOTAL PROTEIN: 7 g/dL (ref 6.0–8.5)

## 2015-05-29 LAB — CBC WITH DIFFERENTIAL/PLATELET
BASOS ABS: 0 10*3/uL (ref 0.0–0.2)
Basos: 0 %
EOS (ABSOLUTE): 0.2 10*3/uL (ref 0.0–0.4)
EOS: 3 %
HEMATOCRIT: 42.2 % (ref 34.0–46.6)
HEMOGLOBIN: 13.8 g/dL (ref 11.1–15.9)
Immature Grans (Abs): 0 10*3/uL (ref 0.0–0.1)
Immature Granulocytes: 0 %
LYMPHS ABS: 3.2 10*3/uL — AB (ref 0.7–3.1)
Lymphs: 35 %
MCH: 32.5 pg (ref 26.6–33.0)
MCHC: 32.7 g/dL (ref 31.5–35.7)
MCV: 99 fL — ABNORMAL HIGH (ref 79–97)
MONOCYTES: 8 %
MONOS ABS: 0.7 10*3/uL (ref 0.1–0.9)
NEUTROS ABS: 5 10*3/uL (ref 1.4–7.0)
Neutrophils: 54 %
Platelets: 233 10*3/uL (ref 150–379)
RBC: 4.25 x10E6/uL (ref 3.77–5.28)
RDW: 13.3 % (ref 12.3–15.4)
WBC: 9.2 10*3/uL (ref 3.4–10.8)

## 2015-05-29 LAB — LIPID PANEL
CHOL/HDL RATIO: 2 ratio (ref 0.0–4.4)
Cholesterol, Total: 131 mg/dL (ref 100–199)
HDL: 66 mg/dL (ref >39)
LDL CALC: 47 mg/dL (ref 0–99)
TRIGLYCERIDES: 90 mg/dL (ref 0–149)
VLDL Cholesterol Cal: 18 mg/dL (ref 5–40)

## 2015-05-29 LAB — THYROID PANEL WITH TSH
FREE THYROXINE INDEX: 2.3 (ref 1.2–4.9)
T3 UPTAKE RATIO: 22 % — AB (ref 24–39)
T4 TOTAL: 10.6 ug/dL (ref 4.5–12.0)
TSH: 2.74 u[IU]/mL (ref 0.450–4.500)

## 2015-05-29 LAB — VITAMIN D 25 HYDROXY (VIT D DEFICIENCY, FRACTURES): Vit D, 25-Hydroxy: 17.3 ng/mL — ABNORMAL LOW (ref 30.0–100.0)

## 2015-08-25 ENCOUNTER — Other Ambulatory Visit: Payer: Self-pay | Admitting: Nurse Practitioner

## 2016-01-03 ENCOUNTER — Other Ambulatory Visit: Payer: Self-pay | Admitting: Nurse Practitioner

## 2016-01-28 ENCOUNTER — Other Ambulatory Visit: Payer: Self-pay | Admitting: *Deleted

## 2016-01-28 DIAGNOSIS — R609 Edema, unspecified: Secondary | ICD-10-CM

## 2016-01-28 MED ORDER — FUROSEMIDE 20 MG PO TABS: 20.0000 mg | ORAL_TABLET | Freq: Every day | ORAL | Status: DC | PRN

## 2016-01-28 NOTE — Telephone Encounter (Signed)
Pt hasn't been seen since 05/28/2015.

## 2016-03-11 ENCOUNTER — Telehealth: Payer: Self-pay | Admitting: Nurse Practitioner

## 2016-03-11 MED ORDER — FUROSEMIDE 40 MG PO TABS: 40.0000 mg | ORAL_TABLET | Freq: Every day | ORAL | Status: DC

## 2016-03-11 NOTE — Telephone Encounter (Signed)
laix increased to 40mg  and rx sent to pharmacy

## 2016-03-11 NOTE — Telephone Encounter (Signed)
Pt aware Rx sent to pharmacy 

## 2016-03-22 DIAGNOSIS — Z1231 Encounter for screening mammogram for malignant neoplasm of breast: Secondary | ICD-10-CM | POA: Diagnosis not present

## 2016-03-31 ENCOUNTER — Other Ambulatory Visit: Payer: Self-pay | Admitting: Nurse Practitioner

## 2016-05-27 ENCOUNTER — Other Ambulatory Visit: Payer: Self-pay | Admitting: Nurse Practitioner

## 2016-05-27 NOTE — Telephone Encounter (Signed)
Please review and advise.

## 2016-06-07 ENCOUNTER — Encounter: Payer: Self-pay | Admitting: Nurse Practitioner

## 2016-06-07 ENCOUNTER — Ambulatory Visit (INDEPENDENT_AMBULATORY_CARE_PROVIDER_SITE_OTHER): Payer: BLUE CROSS/BLUE SHIELD | Admitting: Nurse Practitioner

## 2016-06-07 VITALS — BP 140/90 | HR 92 | Temp 97.5°F | Ht 64.0 in | Wt 359.0 lb

## 2016-06-07 DIAGNOSIS — E785 Hyperlipidemia, unspecified: Secondary | ICD-10-CM

## 2016-06-07 DIAGNOSIS — I10 Essential (primary) hypertension: Secondary | ICD-10-CM | POA: Insufficient documentation

## 2016-06-07 MED ORDER — ATORVASTATIN CALCIUM 40 MG PO TABS: 40.0000 mg | ORAL_TABLET | Freq: Every day | ORAL | 0 refills | Status: DC

## 2016-06-07 MED ORDER — LISINOPRIL 10 MG PO TABS: 10.0000 mg | ORAL_TABLET | Freq: Every day | ORAL | 1 refills | Status: DC

## 2016-06-07 MED ORDER — FUROSEMIDE 40 MG PO TABS: 40.0000 mg | ORAL_TABLET | Freq: Every day | ORAL | 3 refills | Status: DC

## 2016-06-07 NOTE — Patient Instructions (Signed)
Hypertension Hypertension, commonly called high blood pressure, is when the force of blood pumping through your arteries is too strong. Your arteries are the blood vessels that carry blood from your heart throughout your body. A blood pressure reading consists of a higher number over a lower number, such as 110/72. The higher number (systolic) is the pressure inside your arteries when your heart pumps. The lower number (diastolic) is the pressure inside your arteries when your heart relaxes. Ideally you want your blood pressure below 120/80. Hypertension forces your heart to work harder to pump blood. Your arteries may become narrow or stiff. Having untreated or uncontrolled hypertension can cause heart attack, stroke, kidney disease, and other problems. RISK FACTORS Some risk factors for high blood pressure are controllable. Others are not.  Risk factors you cannot control include:   Race. You may be at higher risk if you are African American.  Age. Risk increases with age.  Gender. Men are at higher risk than women before age 45 years. After age 65, women are at higher risk than men. Risk factors you can control include:  Not getting enough exercise or physical activity.  Being overweight.  Getting too much fat, sugar, calories, or salt in your diet.  Drinking too much alcohol. SIGNS AND SYMPTOMS Hypertension does not usually cause signs or symptoms. Extremely high blood pressure (hypertensive crisis) may cause headache, anxiety, shortness of breath, and nosebleed. DIAGNOSIS To check if you have hypertension, your health care provider will measure your blood pressure while you are seated, with your arm held at the level of your heart. It should be measured at least twice using the same arm. Certain conditions can cause a difference in blood pressure between your right and left arms. A blood pressure reading that is higher than normal on one occasion does not mean that you need treatment. If  it is not clear whether you have high blood pressure, you may be asked to return on a different day to have your blood pressure checked again. Or, you may be asked to monitor your blood pressure at home for 1 or more weeks. TREATMENT Treating high blood pressure includes making lifestyle changes and possibly taking medicine. Living a healthy lifestyle can help lower high blood pressure. You may need to change some of your habits. Lifestyle changes may include:  Following the DASH diet. This diet is high in fruits, vegetables, and whole grains. It is low in salt, red meat, and added sugars.  Keep your sodium intake below 2,300 mg per day.  Getting at least 30-45 minutes of aerobic exercise at least 4 times per week.  Losing weight if necessary.  Not smoking.  Limiting alcoholic beverages.  Learning ways to reduce stress. Your health care provider may prescribe medicine if lifestyle changes are not enough to get your blood pressure under control, and if one of the following is true:  You are 18-59 years of age and your systolic blood pressure is above 140.  You are 60 years of age or older, and your systolic blood pressure is above 150.  Your diastolic blood pressure is above 90.  You have diabetes, and your systolic blood pressure is over 140 or your diastolic blood pressure is over 90.  You have kidney disease and your blood pressure is above 140/90.  You have heart disease and your blood pressure is above 140/90. Your personal target blood pressure may vary depending on your medical conditions, your age, and other factors. HOME CARE INSTRUCTIONS    Have your blood pressure rechecked as directed by your health care provider.   Take medicines only as directed by your health care provider. Follow the directions carefully. Blood pressure medicines must be taken as prescribed. The medicine does not work as well when you skip doses. Skipping doses also puts you at risk for  problems.  Do not smoke.   Monitor your blood pressure at home as directed by your health care provider. SEEK MEDICAL CARE IF:   You think you are having a reaction to medicines taken.  You have recurrent headaches or feel dizzy.  You have swelling in your ankles.  You have trouble with your vision. SEEK IMMEDIATE MEDICAL CARE IF:  You develop a severe headache or confusion.  You have unusual weakness, numbness, or feel faint.  You have severe chest or abdominal pain.  You vomit repeatedly.  You have trouble breathing. MAKE SURE YOU:   Understand these instructions.  Will watch your condition.  Will get help right away if you are not doing well or get worse.   This information is not intended to replace advice given to you by your health care provider. Make sure you discuss any questions you have with your health care provider.   Document Released: 08/29/2005 Document Revised: 01/13/2015 Document Reviewed: 06/21/2013 Elsevier Interactive Patient Education 2016 Elsevier Inc.  

## 2016-06-07 NOTE — Progress Notes (Signed)
   Subjective:    Patient ID: Madison Butler, female    DOB: 1970/11/13, 45 y.o.   MRN: [id number]   Patient here today for medication check. She has a complaint of edema in her upper and lower extremities - of note she has not been able to wear her finger rings for the past 5 months. She is currently takes 40 mg of Furosemide daily. She denies chest pain and shortness of breath.  Medication Refill   Hyperlipidemia  This is a chronic problem. The current episode started more than 1 year ago. The problem is uncontrolled. Recent lipid tests were reviewed and are high. Exacerbating diseases include obesity. She has no history of diabetes or hypothyroidism. Current antihyperlipidemic treatment includes statins. The current treatment provides mild improvement of lipids. Compliance problems include adherence to diet and adherence to exercise.  Risk factors for coronary artery disease include obesity and dyslipidemia.  obesity Patient stopped taking her diet pill- she was tired of taking the medication. Heel spurs Relafen makes pain bearable.   Review of Systems  Constitutional: Negative.   HENT: Negative.   Respiratory: Negative.   Cardiovascular: Negative.   Genitourinary: Negative.   Neurological: Negative.   Psychiatric/Behavioral: Negative.   All other systems reviewed and are negative.      Objective:   Physical Exam  Constitutional: She is oriented to person, place, and time. She appears well-developed and well-nourished.  HENT:  Nose: Nose normal.  Mouth/Throat: Oropharynx is clear and moist.  Eyes: EOM are normal.  Neck: Trachea normal, normal range of motion and full passive range of motion without pain. Neck supple. No JVD present. Carotid bruit is not present. No thyromegaly present.  Cardiovascular: Normal rate, regular rhythm, normal heart sounds and intact distal pulses.  Exam reveals no gallop and no friction rub.   No murmur heard. Pulmonary/Chest: Effort normal and  breath sounds normal.  Abdominal: Soft. Bowel sounds are normal. She exhibits no distension and no mass. There is no tenderness.  Musculoskeletal: Normal range of motion. She exhibits edema (2+ left lower leg and 1+ right lower leg).  Lymphadenopathy:    She has no cervical adenopathy.  Neurological: She is alert and oriented to person, place, and time. She has normal reflexes.  Skin: Skin is warm and dry.  Psychiatric: She has a normal mood and affect. Her behavior is normal. Judgment and thought content normal.   BP 140/90 (BP Location: Left Arm, Cuff Size: Large)   Pulse 92   Temp 97.5 F (36.4 C) (Oral)   Ht _0  (1.626 m)   Wt (!) 359 lb (162.8 kg)   BMI 61.62 kg/m      Assessment & Plan:  1. Hyperlipidemia Low fat diet - atorvastatin (LIPITOR) 40 MG tablet; Take 1 tablet (40 mg total) by mouth daily.  Dispense: 90 tablet; Refill: 0 - Lipid panel  2. Morbid obesity, unspecified obesity type ([place]) Weight loss and AHA exercise recommendation discussed  3. Essential hypertension Low salt diet - furosemide (LASIX) 40 MG tablet; Take 1 tablet (40 mg total) by mouth daily.  Dispense: 30 tablet; Refill: 3 - lisinopril (PRINIVIL,ZESTRIL) 10 MG tablet; Take 1 tablet (10 mg total) by mouth daily.  Dispense: 90 tablet; Refill: 1 - CMP14+EGFR   Encouraged to schedule mammogram Labs pending Health maintenance reviewed Diet and exercise encouraged Continue all meds Follow up  in 3 months  Jari Favre, RN, NP Student Mary-Margaret Hassell Done, FNP

## 2016-06-08 LAB — CMP14+EGFR
A/G RATIO: 0.8 — AB (ref 1.2–2.2)
ALK PHOS: 100 IU/L (ref 39–117)
ALT: 98 IU/L — AB (ref 0–32)
AST: 118 IU/L — AB (ref 0–40)
Albumin: 3.2 g/dL — ABNORMAL LOW (ref 3.5–5.5)
BILIRUBIN TOTAL: 0.5 mg/dL (ref 0.0–1.2)
BUN/Creatinine Ratio: 18 (ref 9–23)
BUN: 12 mg/dL (ref 6–24)
CHLORIDE: 102 mmol/L (ref 96–106)
CO2: 23 mmol/L (ref 18–29)
Calcium: 8.9 mg/dL (ref 8.7–10.2)
Creatinine, Ser: 0.67 mg/dL (ref 0.57–1.00)
GFR calc Af Amer: 124 mL/min/{1.73_m2} (ref >59)
GFR calc non Af Amer: 107 mL/min/{1.73_m2} (ref >59)
GLUCOSE: 79 mg/dL (ref 65–99)
Globulin, Total: 4 g/dL (ref 1.5–4.5)
POTASSIUM: 4.2 mmol/L (ref 3.5–5.2)
Sodium: 139 mmol/L (ref 134–144)
Total Protein: 7.2 g/dL (ref 6.0–8.5)

## 2016-06-08 LAB — LIPID PANEL
CHOLESTEROL TOTAL: 127 mg/dL (ref 100–199)
Chol/HDL Ratio: 2.2 ratio units (ref 0.0–4.4)
HDL: 58 mg/dL (ref >39)
LDL Calculated: 53 mg/dL (ref 0–99)
TRIGLYCERIDES: 79 mg/dL (ref 0–149)
VLDL Cholesterol Cal: 16 mg/dL (ref 5–40)

## 2016-06-13 DIAGNOSIS — Z6841 Body Mass Index (BMI) 40.0 and over, adult: Secondary | ICD-10-CM | POA: Diagnosis not present

## 2016-06-13 DIAGNOSIS — Z01419 Encounter for gynecological examination (general) (routine) without abnormal findings: Secondary | ICD-10-CM | POA: Diagnosis not present

## 2016-07-14 DIAGNOSIS — Z3049 Encounter for surveillance of other contraceptives: Secondary | ICD-10-CM | POA: Diagnosis not present

## 2016-07-26 DIAGNOSIS — Z30017 Encounter for initial prescription of implantable subdermal contraceptive: Secondary | ICD-10-CM | POA: Diagnosis not present

## 2016-08-02 DIAGNOSIS — E669 Obesity, unspecified: Secondary | ICD-10-CM | POA: Diagnosis not present

## 2016-08-02 DIAGNOSIS — Z975 Presence of (intrauterine) contraceptive device: Secondary | ICD-10-CM | POA: Diagnosis not present

## 2016-08-02 DIAGNOSIS — E785 Hyperlipidemia, unspecified: Secondary | ICD-10-CM | POA: Diagnosis not present

## 2016-08-02 DIAGNOSIS — Z6841 Body Mass Index (BMI) 40.0 and over, adult: Secondary | ICD-10-CM | POA: Diagnosis not present

## 2016-08-23 ENCOUNTER — Other Ambulatory Visit: Payer: Self-pay | Admitting: Nurse Practitioner

## 2016-09-08 ENCOUNTER — Ambulatory Visit: Payer: BLUE CROSS/BLUE SHIELD | Admitting: Nurse Practitioner

## 2016-09-20 ENCOUNTER — Encounter: Payer: Self-pay | Admitting: Nurse Practitioner

## 2016-09-20 ENCOUNTER — Ambulatory Visit (INDEPENDENT_AMBULATORY_CARE_PROVIDER_SITE_OTHER): Payer: BLUE CROSS/BLUE SHIELD | Admitting: Nurse Practitioner

## 2016-09-20 VITALS — BP 130/90 | HR 93 | Temp 97.8°F | Ht 64.0 in | Wt 355.4 lb

## 2016-09-20 DIAGNOSIS — I10 Essential (primary) hypertension: Secondary | ICD-10-CM | POA: Diagnosis not present

## 2016-09-20 DIAGNOSIS — E782 Mixed hyperlipidemia: Secondary | ICD-10-CM | POA: Diagnosis not present

## 2016-09-20 LAB — CMP14+EGFR
ALBUMIN: 3.2 g/dL — AB (ref 3.5–5.5)
ALT: 79 IU/L — AB (ref 0–32)
AST: 125 IU/L — ABNORMAL HIGH (ref 0–40)
Albumin/Globulin Ratio: 0.9 — ABNORMAL LOW (ref 1.2–2.2)
Alkaline Phosphatase: 145 IU/L — ABNORMAL HIGH (ref 39–117)
BUN / CREAT RATIO: 20 (ref 9–23)
BUN: 12 mg/dL (ref 6–24)
Bilirubin Total: 1.1 mg/dL (ref 0.0–1.2)
CO2: 23 mmol/L (ref 18–29)
Calcium: 9.2 mg/dL (ref 8.7–10.2)
Chloride: 101 mmol/L (ref 96–106)
Creatinine, Ser: 0.59 mg/dL (ref 0.57–1.00)
GFR calc non Af Amer: 112 mL/min/{1.73_m2} (ref >59)
GFR, EST AFRICAN AMERICAN: 129 mL/min/{1.73_m2} (ref >59)
GLUCOSE: 84 mg/dL (ref 65–99)
Globulin, Total: 3.7 g/dL (ref 1.5–4.5)
Potassium: 4.6 mmol/L (ref 3.5–5.2)
Sodium: 138 mmol/L (ref 134–144)
TOTAL PROTEIN: 6.9 g/dL (ref 6.0–8.5)

## 2016-09-20 LAB — LIPID PANEL
CHOLESTEROL TOTAL: 100 mg/dL (ref 100–199)
Chol/HDL Ratio: 2.2 ratio units (ref 0.0–4.4)
HDL: 45 mg/dL (ref >39)
LDL CALC: 40 mg/dL (ref 0–99)
Triglycerides: 75 mg/dL (ref 0–149)
VLDL CHOLESTEROL CAL: 15 mg/dL (ref 5–40)

## 2016-09-20 MED ORDER — LISINOPRIL 10 MG PO TABS: 10.0000 mg | ORAL_TABLET | Freq: Every day | ORAL | 1 refills | Status: DC

## 2016-09-20 MED ORDER — ATORVASTATIN CALCIUM 40 MG PO TABS: 40.0000 mg | ORAL_TABLET | Freq: Every day | ORAL | 1 refills | Status: DC

## 2016-09-20 MED ORDER — FUROSEMIDE 40 MG PO TABS: 40.0000 mg | ORAL_TABLET | Freq: Every day | ORAL | 1 refills | Status: DC

## 2016-09-20 NOTE — Patient Instructions (Signed)
DASH Eating Plan DASH stands for "Dietary Approaches to Stop Hypertension." The DASH eating plan is a healthy eating plan that has been shown to reduce high blood pressure (hypertension). Additional health benefits may include reducing the risk of type 2 diabetes mellitus, heart disease, and stroke. The DASH eating plan may also help with weight loss. What do I need to know about the DASH eating plan? For the DASH eating plan, you will follow these general guidelines:  Choose foods with less than 150 milligrams of sodium per serving (as listed on the food label).  Use salt-free seasonings or herbs instead of table salt or sea salt.  Check with your health care provider or pharmacist before using salt substitutes.  Eat lower-sodium products. These are often labeled as "low-sodium" or "no salt added."  Eat fresh foods. Avoid eating a lot of canned foods.  Eat more vegetables, fruits, and low-fat dairy products.  Choose whole grains. Look for the word "whole" as the first word in the ingredient list.  Choose fish and skinless chicken or turkey more often than red meat. Limit fish, poultry, and meat to 6 oz (170 g) each day.  Limit sweets, desserts, sugars, and sugary drinks.  Choose heart-healthy fats.  Eat more home-cooked food and less restaurant, buffet, and fast food.  Limit fried foods.  Do not fry foods. Cook foods using methods such as baking, boiling, grilling, and broiling instead.  When eating at a restaurant, ask that your food be prepared with less salt, or no salt if possible. What foods can I eat? Seek help from a dietitian for individual calorie needs. Grains  Whole grain or whole wheat bread. Brown rice. Whole grain or whole wheat pasta. Quinoa, bulgur, and whole grain cereals. Low-sodium cereals. Corn or whole wheat flour tortillas. Whole grain cornbread. Whole grain crackers. Low-sodium crackers. Vegetables  Fresh or frozen vegetables (raw, steamed, roasted, or  grilled). Low-sodium or reduced-sodium tomato and vegetable juices. Low-sodium or reduced-sodium tomato sauce and paste. Low-sodium or reduced-sodium canned vegetables. Fruits  All fresh, canned (in natural juice), or frozen fruits. Meat and Other Protein Products  Ground beef (85% or leaner), grass-fed beef, or beef trimmed of fat. Skinless chicken or turkey. Ground chicken or turkey. Pork trimmed of fat. All fish and seafood. Eggs. Dried beans, peas, or lentils. Unsalted nuts and seeds. Unsalted canned beans. Dairy  Low-fat dairy products, such as skim or 1% milk, 2% or reduced-fat cheeses, low-fat ricotta or cottage cheese, or plain low-fat yogurt. Low-sodium or reduced-sodium cheeses. Fats and Oils  Tub margarines without trans fats. Light or reduced-fat mayonnaise and salad dressings (reduced sodium). Avocado. Safflower, olive, or canola oils. Natural peanut or almond butter. Other  Unsalted popcorn and pretzels. The items listed above may not be a complete list of recommended foods or beverages. Contact your dietitian for more options.  What foods are not recommended? Grains  White bread. White pasta. White rice. Refined cornbread. Bagels and croissants. Crackers that contain trans fat. Vegetables  Creamed or fried vegetables. Vegetables in a cheese sauce. Regular canned vegetables. Regular canned tomato sauce and paste. Regular tomato and vegetable juices. Fruits  Canned fruit in light or heavy syrup. Fruit juice. Meat and Other Protein Products  Fatty cuts of meat. Ribs, chicken wings, bacon, sausage, bologna, salami, chitterlings, fatback, hot dogs, bratwurst, and packaged luncheon meats. Salted nuts and seeds. Canned beans with salt. Dairy  Whole or 2% milk, cream, half-and-half, and cream cheese. Whole-fat or sweetened yogurt. Full-fat cheeses   or blue cheese. Nondairy creamers and whipped toppings. Processed cheese, cheese spreads, or cheese curds. Condiments  Onion and garlic  salt, seasoned salt, table salt, and sea salt. Canned and packaged gravies. Worcestershire sauce. Tartar sauce. Barbecue sauce. Teriyaki sauce. Soy sauce, including reduced sodium. Steak sauce. Fish sauce. Oyster sauce. Cocktail sauce. Horseradish. Ketchup and mustard. Meat flavorings and tenderizers. Bouillon cubes. Hot sauce. Tabasco sauce. Marinades. Taco seasonings. Relishes. Fats and Oils  Butter, stick margarine, lard, shortening, ghee, and bacon fat. Coconut, palm kernel, or palm oils. Regular salad dressings. Other  Pickles and olives. Salted popcorn and pretzels. The items listed above may not be a complete list of foods and beverages to avoid. Contact your dietitian for more information.  Where can I find more information? National Heart, Lung, and [employer] Institute: www.nhlbi.nih.gov/health/health-topics/topics/dash/ This information is not intended to replace advice given to you by your health care provider. Make sure you discuss any questions you have with your health care provider. Document Released: 08/18/2011 Document Revised: 02/04/2016 Document Reviewed: 07/03/2013 Elsevier Interactive Patient Education  2017 Elsevier Inc.  

## 2016-09-20 NOTE — Progress Notes (Signed)
Subjective:    Patient ID: Madison Butler, female    DOB: 07/11/1971, 46 y.o.   MRN: [id number]   Patient here today for follow up of chronic medical problems. Last visit patient was having lots of edema and was unable to even wear her rings. We made no changes to her meds and told her to decrease sodium in diet and force fluids. DOing much better.  Outpatient Encounter Prescriptions as of 09/20/2016  Medication Sig  . atorvastatin (LIPITOR) 40 MG tablet Take 1 tablet (40 mg total) by mouth daily.  . nabumetone (RELAFEN) 750 MG tablet TAKE 1 TABLET BY MOUTH TWICE A DAY  . Albuterol Sulfate (PROAIR HFA IN) Inhale into the lungs.  . furosemide (LASIX) 40 MG tablet Take 1 tablet (40 mg total) by mouth daily. (Patient not taking: Reported on 09/20/2016)  . lisinopril (PRINIVIL,ZESTRIL) 10 MG tablet Take 1 tablet (10 mg total) by mouth daily. (Patient not taking: Reported on 09/20/2016)  . TRI-PREVIFEM 0.18/0.215/0.25 MG-35 MCG tablet Take 1 tablet by mouth daily.    No facility-administered encounter medications on file as of 09/20/2016.     Hyperlipidemia  This is a chronic problem. The current episode started more than 1 year ago. The problem is uncontrolled. Recent lipid tests were reviewed and are high. Exacerbating diseases include obesity. She has no history of diabetes or hypothyroidism. Pertinent negatives include no chest pain or shortness of breath. Current antihyperlipidemic treatment includes statins. The current treatment provides mild improvement of lipids. Compliance problems include adherence to diet and adherence to exercise.  Risk factors for coronary artery disease include obesity and dyslipidemia.  Hypertension  This is a chronic problem. The current episode started more than 1 year ago. The problem is controlled. Pertinent negatives include no chest pain, peripheral edema or shortness of breath. There are no associated agents to hypertension. Risk factors for coronary artery disease  include dyslipidemia, obesity and sedentary lifestyle. Past treatments include ACE inhibitors. The current treatment provides moderate improvement. Compliance problems include diet and exercise.  There is no history of CAD/MI or CVA.  obesity Patient stopped taking her diet pill- she was tired of taking the medication. Heel spurs Relafen makes pain bearable.   Review of Systems  Constitutional: Negative.   HENT: Negative.   Respiratory: Negative.  Negative for shortness of breath.   Cardiovascular: Negative.  Negative for chest pain.  Genitourinary: Negative.   Neurological: Negative.   Psychiatric/Behavioral: Negative.   All other systems reviewed and are negative.      Objective:   Physical Exam  Constitutional: She is oriented to person, place, and time. She appears well-developed and well-nourished.  HENT:  Nose: Nose normal.  Mouth/Throat: Oropharynx is clear and moist.  Eyes: EOM are normal.  Neck: Trachea normal, normal range of motion and full passive range of motion without pain. Neck supple. No JVD present. Carotid bruit is not present. No thyromegaly present.  Cardiovascular: Normal rate, regular rhythm, normal heart sounds and intact distal pulses.  Exam reveals no gallop and no friction rub.   No murmur heard. Pulmonary/Chest: Effort normal and breath sounds normal.  Abdominal: Soft. Bowel sounds are normal. She exhibits no distension and no mass. There is no tenderness.  Musculoskeletal: Normal range of motion. She exhibits edema (2+ left lower leg and 1+ right lower leg).  Lymphadenopathy:    She has no cervical adenopathy.  Neurological: She is alert and oriented to person, place, and time. She has normal reflexes.  Skin:  Skin is warm and dry.  Psychiatric: She has a normal mood and affect. Her behavior is normal. Judgment and thought content normal.   BP (!) 134/96   Pulse 93   Temp 97.8 F (36.6 C) (Oral)   Ht _0  (1.626 m)   Wt (!) 355 lb 6.4 oz (161.2  kg)   BMI 61.00 kg/m      Assessment & Plan:  1. Essential hypertension Low sodium diet - furosemide (LASIX) 40 MG tablet; Take 1 tablet (40 mg total) by mouth daily.  Dispense: 90 tablet; Refill: 1 - lisinopril (PRINIVIL,ZESTRIL) 10 MG tablet; Take 1 tablet (10 mg total) by mouth daily.  Dispense: 90 tablet; Refill: 1 - CMP14+EGFR  2. Mixed hyperlipidemia Low fat diet - atorvastatin (LIPITOR) 40 MG tablet; Take 1 tablet (40 mg total) by mouth daily.  Dispense: 90 tablet; Refill: 1 - Lipid panel  3. Morbid obesity ([place]) Discussed diet and exercise for person with BMI >25 Will recheck weight in 3-6 months Encouraged weight watchers    Labs pending Health maintenance reviewed Diet and exercise encouraged Continue all meds Follow up  In 6 month  [place], FNP

## 2016-10-05 ENCOUNTER — Other Ambulatory Visit: Payer: Self-pay | Admitting: *Deleted

## 2016-10-05 ENCOUNTER — Telehealth: Payer: Self-pay | Admitting: Nurse Practitioner

## 2016-10-05 ENCOUNTER — Other Ambulatory Visit: Payer: Self-pay

## 2016-10-05 DIAGNOSIS — R7989 Other specified abnormal findings of blood chemistry: Secondary | ICD-10-CM

## 2016-10-05 DIAGNOSIS — R945 Abnormal results of liver function studies: Principal | ICD-10-CM

## 2016-10-05 NOTE — Telephone Encounter (Signed)
Patient calling about hep panel that was added 09/22/16. Spoke with lab and it was not added on. Patient aware she needs to come in to have it re drawn. Order placed.

## 2016-10-08 ENCOUNTER — Other Ambulatory Visit: Payer: BLUE CROSS/BLUE SHIELD

## 2016-10-08 DIAGNOSIS — R7989 Other specified abnormal findings of blood chemistry: Secondary | ICD-10-CM | POA: Diagnosis not present

## 2016-10-08 DIAGNOSIS — R945 Abnormal results of liver function studies: Principal | ICD-10-CM

## 2016-10-09 LAB — HEPATITIS PANEL, ACUTE
Hep A IgM: NEGATIVE
Hep B C IgM: NEGATIVE
Hepatitis B Surface Ag: NEGATIVE

## 2016-10-18 DIAGNOSIS — Z3049 Encounter for surveillance of other contraceptives: Secondary | ICD-10-CM | POA: Diagnosis not present

## 2016-11-03 DIAGNOSIS — Z30430 Encounter for insertion of intrauterine contraceptive device: Secondary | ICD-10-CM | POA: Diagnosis not present

## 2016-12-01 DIAGNOSIS — Z30431 Encounter for routine checking of intrauterine contraceptive device: Secondary | ICD-10-CM | POA: Diagnosis not present

## 2016-12-15 DIAGNOSIS — R609 Edema, unspecified: Secondary | ICD-10-CM | POA: Diagnosis not present

## 2016-12-15 DIAGNOSIS — R509 Fever, unspecified: Secondary | ICD-10-CM | POA: Diagnosis not present

## 2016-12-15 DIAGNOSIS — L03116 Cellulitis of left lower limb: Secondary | ICD-10-CM | POA: Diagnosis not present

## 2016-12-22 DIAGNOSIS — Z131 Encounter for screening for diabetes mellitus: Secondary | ICD-10-CM | POA: Diagnosis not present

## 2016-12-22 DIAGNOSIS — R609 Edema, unspecified: Secondary | ICD-10-CM | POA: Diagnosis not present

## 2016-12-22 DIAGNOSIS — I8393 Asymptomatic varicose veins of bilateral lower extremities: Secondary | ICD-10-CM | POA: Diagnosis not present

## 2016-12-22 DIAGNOSIS — Z6841 Body Mass Index (BMI) 40.0 and over, adult: Secondary | ICD-10-CM | POA: Diagnosis not present

## 2016-12-23 DIAGNOSIS — M79605 Pain in left leg: Secondary | ICD-10-CM | POA: Diagnosis not present

## 2016-12-23 DIAGNOSIS — R2242 Localized swelling, mass and lump, left lower limb: Secondary | ICD-10-CM | POA: Diagnosis not present

## 2017-01-13 DIAGNOSIS — Z6841 Body Mass Index (BMI) 40.0 and over, adult: Secondary | ICD-10-CM | POA: Diagnosis not present

## 2017-01-13 DIAGNOSIS — I10 Essential (primary) hypertension: Secondary | ICD-10-CM | POA: Diagnosis not present

## 2017-01-13 DIAGNOSIS — R609 Edema, unspecified: Secondary | ICD-10-CM | POA: Diagnosis not present

## 2017-01-16 ENCOUNTER — Other Ambulatory Visit: Payer: Self-pay | Admitting: Nurse Practitioner

## 2017-04-18 ENCOUNTER — Other Ambulatory Visit: Payer: Self-pay | Admitting: Nurse Practitioner

## 2017-04-18 DIAGNOSIS — E782 Mixed hyperlipidemia: Secondary | ICD-10-CM

## 2017-04-18 NOTE — Telephone Encounter (Signed)
Last refill without being seen 

## 2017-04-18 NOTE — Telephone Encounter (Signed)
Last lipid 09/20/16  

## 2017-04-28 ENCOUNTER — Other Ambulatory Visit: Payer: Self-pay | Admitting: Nurse Practitioner

## 2017-04-28 DIAGNOSIS — I10 Essential (primary) hypertension: Secondary | ICD-10-CM

## 2017-06-10 ENCOUNTER — Other Ambulatory Visit: Payer: Self-pay | Admitting: Nurse Practitioner

## 2017-06-10 DIAGNOSIS — I10 Essential (primary) hypertension: Secondary | ICD-10-CM

## 2017-06-15 DIAGNOSIS — Z6841 Body Mass Index (BMI) 40.0 and over, adult: Secondary | ICD-10-CM | POA: Diagnosis not present

## 2017-06-15 DIAGNOSIS — Z01411 Encounter for gynecological examination (general) (routine) with abnormal findings: Secondary | ICD-10-CM | POA: Diagnosis not present

## 2017-06-30 DIAGNOSIS — Z23 Encounter for immunization: Secondary | ICD-10-CM | POA: Diagnosis not present

## 2017-07-19 ENCOUNTER — Other Ambulatory Visit: Payer: Self-pay | Admitting: Nurse Practitioner

## 2017-07-19 DIAGNOSIS — E782 Mixed hyperlipidemia: Secondary | ICD-10-CM

## 2017-07-19 NOTE — Telephone Encounter (Signed)
Last refill without being seen 

## 2017-07-19 NOTE — Telephone Encounter (Signed)
Last lipid 09/20/16

## 2017-08-16 ENCOUNTER — Other Ambulatory Visit: Payer: Self-pay | Admitting: Nurse Practitioner

## 2017-08-16 DIAGNOSIS — E782 Mixed hyperlipidemia: Secondary | ICD-10-CM

## 2017-08-16 NOTE — Telephone Encounter (Signed)
Last refill without being seen 

## 2017-08-16 NOTE — Telephone Encounter (Signed)
Last seen 09/20/16  MMM

## 2017-08-16 NOTE — Telephone Encounter (Signed)
Left message- rx sent but will need to be seen for any other refills.

## 2017-08-18 ENCOUNTER — Other Ambulatory Visit: Payer: Self-pay

## 2017-08-18 DIAGNOSIS — E782 Mixed hyperlipidemia: Secondary | ICD-10-CM

## 2017-09-13 ENCOUNTER — Other Ambulatory Visit: Payer: Self-pay | Admitting: Nurse Practitioner

## 2017-09-13 DIAGNOSIS — E782 Mixed hyperlipidemia: Secondary | ICD-10-CM

## 2017-09-14 ENCOUNTER — Other Ambulatory Visit: Payer: Self-pay | Admitting: Nurse Practitioner

## 2017-09-14 DIAGNOSIS — E782 Mixed hyperlipidemia: Secondary | ICD-10-CM

## 2017-09-14 MED ORDER — ATORVASTATIN CALCIUM 40 MG PO TABS: 40.0000 mg | ORAL_TABLET | Freq: Every day | ORAL | 0 refills | Status: DC

## 2017-09-14 NOTE — Telephone Encounter (Signed)
Refill sent to pharmacy [facility] 10/17/17

## 2017-10-14 ENCOUNTER — Other Ambulatory Visit: Payer: Self-pay | Admitting: Nurse Practitioner

## 2017-10-14 DIAGNOSIS — E782 Mixed hyperlipidemia: Secondary | ICD-10-CM

## 2017-10-17 ENCOUNTER — Ambulatory Visit (INDEPENDENT_AMBULATORY_CARE_PROVIDER_SITE_OTHER): Payer: BLUE CROSS/BLUE SHIELD | Admitting: Nurse Practitioner

## 2017-10-17 ENCOUNTER — Encounter: Payer: Self-pay | Admitting: Nurse Practitioner

## 2017-10-17 VITALS — BP 118/62 | HR 66 | Temp 97.4°F | Ht 63.0 in | Wt 342.0 lb

## 2017-10-17 DIAGNOSIS — I10 Essential (primary) hypertension: Secondary | ICD-10-CM | POA: Diagnosis not present

## 2017-10-17 DIAGNOSIS — Z0000 Encounter for general adult medical examination without abnormal findings: Secondary | ICD-10-CM | POA: Diagnosis not present

## 2017-10-17 DIAGNOSIS — E782 Mixed hyperlipidemia: Secondary | ICD-10-CM | POA: Diagnosis not present

## 2017-10-17 MED ORDER — NABUMETONE 750 MG PO TABS: 750.0000 mg | ORAL_TABLET | Freq: Two times a day (BID) | ORAL | 1 refills | Status: DC

## 2017-10-17 MED ORDER — LISINOPRIL 10 MG PO TABS: 10.0000 mg | ORAL_TABLET | Freq: Every day | ORAL | 1 refills | Status: DC

## 2017-10-17 MED ORDER — ATORVASTATIN CALCIUM 40 MG PO TABS: 40.0000 mg | ORAL_TABLET | Freq: Every day | ORAL | 5 refills | Status: DC

## 2017-10-17 MED ORDER — FUROSEMIDE 40 MG PO TABS: 40.0000 mg | ORAL_TABLET | Freq: Every day | ORAL | 1 refills | Status: DC

## 2017-10-17 NOTE — Patient Instructions (Signed)
Exercising to Lose Weight Exercising can help you to lose weight. In order to lose weight through exercise, you need to do vigorous-intensity exercise. You can tell that you are exercising with vigorous intensity if you are breathing very hard and fast and cannot hold a conversation while exercising. Moderate-intensity exercise helps to maintain your current weight. You can tell that you are exercising at a moderate level if you have a higher heart rate and faster breathing, but you are still able to hold a conversation. How often should I exercise? Choose an activity that you enjoy and set realistic goals. Your health care provider can help you to make an activity plan that works for you. Exercise regularly as directed by your health care provider. This may include:  Doing resistance training twice each week, such as: ? Push-ups. ? Sit-ups. ? Lifting weights. ? Using resistance bands.  Doing a given intensity of exercise for a given amount of time. Choose from these options: ? 150 minutes of moderate-intensity exercise every week. ? 75 minutes of vigorous-intensity exercise every week. ? A mix of moderate-intensity and vigorous-intensity exercise every week.  Children, pregnant women, people who are out of shape, people who are overweight, and older adults may need to consult a health care provider for individual recommendations. If you have any sort of medical condition, be sure to consult your health care provider before starting a new exercise program. What are some activities that can help me to lose weight?  Walking at a rate of at least 4.5 miles an hour.  Jogging or running at a rate of 5 miles per hour.  Biking at a rate of at least 10 miles per hour.  Lap swimming.  Roller-skating or in-line skating.  Cross-country skiing.  Vigorous competitive sports, such as football, basketball, and soccer.  Jumping rope.  Aerobic dancing. How can I be more active in my day-to-day  activities?  Use the stairs instead of the elevator.  Take a walk during your lunch break.  If you drive, park your car farther away from work or school.  If you take public transportation, get off one stop early and walk the rest of the way.  Make all of your phone calls while standing up and walking around.  Get up, stretch, and walk around every 30 minutes throughout the day. What guidelines should I follow while exercising?  Do not exercise so much that you hurt yourself, feel dizzy, or get very short of breath.  Consult your health care provider prior to starting a new exercise program.  Wear comfortable clothes and shoes with good support.  Drink plenty of water while you exercise to prevent dehydration or heat stroke. Body water is lost during exercise and must be replaced.  Work out until you breathe faster and your heart beats faster. This information is not intended to replace advice given to you by your health care provider. Make sure you discuss any questions you have with your health care provider. Document Released: 10/01/2010 Document Revised: 02/04/2016 Document Reviewed: 01/30/2014 Elsevier Interactive Patient Education  2018 Elsevier Inc.  

## 2017-10-17 NOTE — Progress Notes (Signed)
Subjective:    Patient ID: Madison Butler, female    DOB: March 25, 1971, 47 y.o.   MRN: [id number]  HPI Madison Butler is here today for annual exam and  follow up of chronic medical problem.  Outpatient Encounter Medications as of 10/17/2017  Medication Sig  . Albuterol Sulfate (PROAIR HFA IN) Inhale into the lungs.  Marland Kitchen atorvastatin (LIPITOR) 40 MG tablet TAKE 1 TABLET BY MOUTH EVERY DAY  . furosemide (LASIX) 40 MG tablet TAKE 1 TABLET BY MOUTH DAILY  . lisinopril (PRINIVIL,ZESTRIL) 10 MG tablet TAKE 1 TABLET BY MOUTH DAILY  . nabumetone (RELAFEN) 750 MG tablet TAKE 1 TABLET BY MOUTH TWICE A DAY     1. Annual physical exam  Time for annual exam  2. Essential hypertension  No c/o chest pain, sob or headache. Does not usually check blood pressures at home.  3. Mixed hyperlipidemia  Does not watch diet at all and very little exercise.  4. Morbid obesity ([place])  No recent weight changes    New complaints: None today  Social history:  * sees DR. Mccloud for her paps and she had one in July 2019  Review of Systems  Constitutional: Negative for activity change and appetite change.  HENT: Negative.   Eyes: Negative for pain.  Respiratory: Negative for shortness of breath.   Cardiovascular: Negative for chest pain, palpitations and leg swelling.  Gastrointestinal: Negative for abdominal pain.  Endocrine: Negative for polydipsia.  Genitourinary: Negative.   Skin: Negative for rash.  Neurological: Negative for dizziness, weakness and headaches.  Hematological: Does not bruise/bleed easily.  Psychiatric/Behavioral: Negative.   All other systems reviewed and are negative.      Objective:   Physical Exam  Constitutional: She is oriented to person, place, and time. She appears well-developed and well-nourished.  HENT:  Nose: Nose normal.  Mouth/Throat: Oropharynx is clear and moist.  Eyes: EOM are normal.  Neck: Trachea normal, normal range of motion and full passive range of  motion without pain. Neck supple. No JVD present. Carotid bruit is not present. No thyromegaly present.  Cardiovascular: Normal rate, regular rhythm, normal heart sounds and intact distal pulses. Exam reveals no gallop and no friction rub.  No murmur heard. Pulmonary/Chest: Effort normal and breath sounds normal.  Abdominal: Soft. Bowel sounds are normal. She exhibits no distension and no mass. There is no tenderness.  Musculoskeletal: Normal range of motion.  Lymphadenopathy:    She has no cervical adenopathy.  Neurological: She is alert and oriented to person, place, and time. She has normal reflexes.  Skin: Skin is warm and dry.  Psychiatric: She has a normal mood and affect. Her behavior is normal. Judgment and thought content normal.   BP 118/62   Pulse 66   Temp (!) 97.4 F (36.3 C) (Oral)   Ht _0  (1.6 m)   Wt (!) 342 lb (155.1 kg)   BMI 60.58 kg/m      Assessment & Plan:  1. Annual physical exam - CBC with Differential/Platelet - Thyroid Panel With TSH  2. Essential hypertension loe sodium diet - CMP14+EGFR - lisinopril (PRINIVIL,ZESTRIL) 10 MG tablet; Take 1 tablet (10 mg total) by mouth daily.  Dispense: 90 tablet; Refill: 1 - furosemide (LASIX) 40 MG tablet; Take 1 tablet (40 mg total) by mouth daily.  Dispense: 90 tablet; Refill: 1  3. Mixed hyperlipidemia Low fat diet - Lipid panel - atorvastatin (LIPITOR) 40 MG tablet; Take 1 tablet (40 mg total) by mouth daily.  Dispense: 30 tablet; Refill: 5  4. Morbid obesity ([place]) Discussed diet and exercise for person with BMI >25 Will recheck weight in 3-6 months     Labs pending Health maintenance reviewed Diet and exercise encouraged Continue all meds Follow up  In 6 months   [place], FNP

## 2017-10-18 LAB — CMP14+EGFR
ALK PHOS: 140 IU/L — AB (ref 39–117)
ALT: 18 IU/L (ref 0–32)
AST: 22 IU/L (ref 0–40)
Albumin/Globulin Ratio: 1.1 — ABNORMAL LOW (ref 1.2–2.2)
Albumin: 4 g/dL (ref 3.5–5.5)
BILIRUBIN TOTAL: 0.8 mg/dL (ref 0.0–1.2)
BUN / CREAT RATIO: 14 (ref 9–23)
BUN: 10 mg/dL (ref 6–24)
CO2: 22 mmol/L (ref 20–29)
Calcium: 9.7 mg/dL (ref 8.7–10.2)
Chloride: 103 mmol/L (ref 96–106)
Creatinine, Ser: 0.7 mg/dL (ref 0.57–1.00)
GFR calc non Af Amer: 105 mL/min/{1.73_m2} (ref >59)
GFR, EST AFRICAN AMERICAN: 121 mL/min/{1.73_m2} (ref >59)
GLUCOSE: 93 mg/dL (ref 65–99)
Globulin, Total: 3.6 g/dL (ref 1.5–4.5)
POTASSIUM: 5.7 mmol/L — AB (ref 3.5–5.2)
SODIUM: 141 mmol/L (ref 134–144)
Total Protein: 7.6 g/dL (ref 6.0–8.5)

## 2017-10-18 LAB — CBC WITH DIFFERENTIAL/PLATELET
BASOS ABS: 0 10*3/uL (ref 0.0–0.2)
Basos: 0 %
EOS (ABSOLUTE): 0.6 10*3/uL — AB (ref 0.0–0.4)
Eos: 6 %
Hematocrit: 41.2 % (ref 34.0–46.6)
Hemoglobin: 13.2 g/dL (ref 11.1–15.9)
IMMATURE GRANS (ABS): 0 10*3/uL (ref 0.0–0.1)
Immature Granulocytes: 0 %
LYMPHS ABS: 2.8 10*3/uL (ref 0.7–3.1)
LYMPHS: 30 %
MCH: 32.4 pg (ref 26.6–33.0)
MCHC: 32 g/dL (ref 31.5–35.7)
MCV: 101 fL — ABNORMAL HIGH (ref 79–97)
Monocytes Absolute: 0.6 10*3/uL (ref 0.1–0.9)
Monocytes: 6 %
NEUTROS ABS: 5.6 10*3/uL (ref 1.4–7.0)
Neutrophils: 58 %
Platelets: 194 10*3/uL (ref 150–379)
RBC: 4.07 x10E6/uL (ref 3.77–5.28)
RDW: 13.8 % (ref 12.3–15.4)
WBC: 9.6 10*3/uL (ref 3.4–10.8)

## 2017-10-18 LAB — LIPID PANEL
CHOL/HDL RATIO: 2.8 ratio (ref 0.0–4.4)
Cholesterol, Total: 86 mg/dL — ABNORMAL LOW (ref 100–199)
HDL: 31 mg/dL — ABNORMAL LOW (ref >39)
LDL CALC: 45 mg/dL (ref 0–99)
Triglycerides: 49 mg/dL (ref 0–149)
VLDL Cholesterol Cal: 10 mg/dL (ref 5–40)

## 2017-10-18 LAB — THYROID PANEL WITH TSH
Free Thyroxine Index: 2.3 (ref 1.2–4.9)
T3 UPTAKE RATIO: 30 % (ref 24–39)
T4 TOTAL: 7.6 ug/dL (ref 4.5–12.0)
TSH: 3.45 u[IU]/mL (ref 0.450–4.500)

## 2017-10-30 DIAGNOSIS — Z1231 Encounter for screening mammogram for malignant neoplasm of breast: Secondary | ICD-10-CM | POA: Diagnosis not present

## 2017-11-09 DIAGNOSIS — J029 Acute pharyngitis, unspecified: Secondary | ICD-10-CM | POA: Diagnosis not present

## 2017-11-16 DIAGNOSIS — J3089 Other allergic rhinitis: Secondary | ICD-10-CM | POA: Diagnosis not present

## 2018-03-15 ENCOUNTER — Other Ambulatory Visit: Payer: Self-pay | Admitting: Nurse Practitioner

## 2018-03-15 DIAGNOSIS — I10 Essential (primary) hypertension: Secondary | ICD-10-CM

## 2018-04-12 ENCOUNTER — Other Ambulatory Visit: Payer: Self-pay | Admitting: Nurse Practitioner

## 2018-04-12 DIAGNOSIS — I10 Essential (primary) hypertension: Secondary | ICD-10-CM

## 2018-04-12 NOTE — Telephone Encounter (Signed)
Last seen 10/17/17  MMM

## 2018-04-25 ENCOUNTER — Ambulatory Visit: Payer: BLUE CROSS/BLUE SHIELD | Admitting: Nurse Practitioner

## 2018-05-22 ENCOUNTER — Ambulatory Visit: Payer: BLUE CROSS/BLUE SHIELD | Admitting: Nurse Practitioner

## 2018-05-22 ENCOUNTER — Encounter: Payer: Self-pay | Admitting: Nurse Practitioner

## 2018-05-22 VITALS — BP 99/75 | HR 88 | Temp 97.3°F | Ht 63.0 in | Wt 318.0 lb

## 2018-05-22 DIAGNOSIS — E782 Mixed hyperlipidemia: Secondary | ICD-10-CM

## 2018-05-22 DIAGNOSIS — I10 Essential (primary) hypertension: Secondary | ICD-10-CM

## 2018-05-22 MED ORDER — LISINOPRIL 10 MG PO TABS: 10.0000 mg | ORAL_TABLET | Freq: Every day | ORAL | 1 refills | Status: DC

## 2018-05-22 MED ORDER — FUROSEMIDE 40 MG PO TABS: 40.0000 mg | ORAL_TABLET | Freq: Every day | ORAL | 1 refills | Status: DC

## 2018-05-22 MED ORDER — ATORVASTATIN CALCIUM 40 MG PO TABS: 40.0000 mg | ORAL_TABLET | Freq: Every day | ORAL | 5 refills | Status: DC

## 2018-05-22 NOTE — Patient Instructions (Signed)

## 2018-05-22 NOTE — Progress Notes (Signed)
Subjective:    Patient ID: Madison Butler, female    DOB: 08-20-1971, 47 y.o.   MRN: [id number]   Chief Complaint: Medical Management of chronic issues  HPI:  1. Essential hypertension  No c/o chest pain, sob or headache. Does not check bloodpressure at home. BP Readings from Last 3 Encounters:  10/17/17 118/62  09/20/16 130/90  06/07/16 140/90     2. Morbid obesity ([place])  Weight is down 34lbs. She startd weight watchers in January  3. Mixed hyperlipidemia  Does not watch diet and does no exercise. Takes lipitor daily    Outpatient Encounter Medications as of 05/22/2018  Medication Sig  . atorvastatin (LIPITOR) 40 MG tablet Take 1 tablet (40 mg total) by mouth daily.  . furosemide (LASIX) 40 MG tablet TAKE 1 TABLET BY MOUTH EVERY DAY  . lisinopril (PRINIVIL,ZESTRIL) 10 MG tablet TAKE 1 TABLET BY MOUTH EVERY DAY  . nabumetone (RELAFEN) 750 MG tablet Take 1 tablet (750 mg total) by mouth 2 (two) times daily.     New complaints: None  today  Social history: Lives with husband of 19years- 2 step kids and 1 of her own   Review of Systems  Constitutional: Negative for activity change and appetite change.  HENT: Negative.   Eyes: Negative for pain.  Respiratory: Negative for shortness of breath.   Cardiovascular: Negative for chest pain, palpitations and leg swelling.  Gastrointestinal: Negative for abdominal pain.  Endocrine: Negative for polydipsia.  Genitourinary: Negative.   Skin: Negative for rash.  Neurological: Negative for dizziness, weakness and headaches.  Hematological: Does not bruise/bleed easily.  Psychiatric/Behavioral: Negative.   All other systems reviewed and are negative.      Objective:   Physical Exam  Constitutional: She is oriented to person, place, and time. She appears well-developed and well-nourished. No distress.  HENT:  Head: Normocephalic.  Nose: Nose normal.  Mouth/Throat: Oropharynx is clear and moist.  Eyes: Pupils are equal,  round, and reactive to light. EOM are normal.  Neck: Normal range of motion. Neck supple. No JVD present. Carotid bruit is not present.  Cardiovascular: Normal rate, regular rhythm, normal heart sounds and intact distal pulses.  Pulmonary/Chest: Effort normal and breath sounds normal. No respiratory distress. She has no wheezes. She has no rales. She exhibits no tenderness.  Abdominal: Soft. Normal appearance, normal aorta and bowel sounds are normal. She exhibits no distension, no abdominal bruit, no pulsatile midline mass and no mass. There is no splenomegaly or hepatomegaly. There is no tenderness.  Musculoskeletal: Normal range of motion. She exhibits no edema.  Lymphadenopathy:    She has no cervical adenopathy.  Neurological: She is alert and oriented to person, place, and time. She has normal reflexes.  Skin: Skin is warm and dry.  Psychiatric: She has a normal mood and affect. Her behavior is normal. Judgment and thought content normal.  Nursing note and vitals reviewed.   BP 99/75   Pulse 88   Temp (!) 97.3 F (36.3 C) (Oral)   Ht '5\' 3"'$  (1.6 m)   Wt (!) 318 lb (144.2 kg)   BMI 56.33 kg/m        Assessment & Plan:  Madison Butler comes in today with chief complaint of Medical Management of Chronic Issues   Diagnosis and orders addressed:  1. Essential hypertension Low sodium diet - furosemide (LASIX) 40 MG tablet; Take 1 tablet (40 mg total) by mouth daily.  Dispense: 90 tablet; Refill: 1 - lisinopril (PRINIVIL,ZESTRIL) 10 MG  tablet; Take 1 tablet (10 mg total) by mouth daily.  Dispense: 90 tablet; Refill: 1 - CMP14+EGFR  2. Morbid obesity ([place]) Continue weight watchers  3. Mixed hyperlipidemia Low fat diet - atorvastatin (LIPITOR) 40 MG tablet; Take 1 tablet (40 mg total) by mouth daily.  Dispense: 30 tablet; Refill: 5 - Lipid panel   Labs pending Health Maintenance reviewed Diet and exercise encouraged  Follow up plan: 6 months   [place],  FNP

## 2018-05-23 LAB — CMP14+EGFR
A/G RATIO: 1.1 — AB (ref 1.2–2.2)
ALBUMIN: 3.7 g/dL (ref 3.5–5.5)
ALT: 17 IU/L (ref 0–32)
AST: 19 IU/L (ref 0–40)
Alkaline Phosphatase: 144 IU/L — ABNORMAL HIGH (ref 39–117)
BILIRUBIN TOTAL: 0.6 mg/dL (ref 0.0–1.2)
BUN/Creatinine Ratio: 24 — ABNORMAL HIGH (ref 9–23)
BUN: 18 mg/dL (ref 6–24)
CO2: 25 mmol/L (ref 20–29)
Calcium: 9.3 mg/dL (ref 8.7–10.2)
Chloride: 100 mmol/L (ref 96–106)
Creatinine, Ser: 0.75 mg/dL (ref 0.57–1.00)
GFR, EST AFRICAN AMERICAN: 111 mL/min/{1.73_m2} (ref >59)
GFR, EST NON AFRICAN AMERICAN: 96 mL/min/{1.73_m2} (ref >59)
GLOBULIN, TOTAL: 3.4 g/dL (ref 1.5–4.5)
Glucose: 84 mg/dL (ref 65–99)
POTASSIUM: 4.8 mmol/L (ref 3.5–5.2)
SODIUM: 140 mmol/L (ref 134–144)
TOTAL PROTEIN: 7.1 g/dL (ref 6.0–8.5)

## 2018-05-23 LAB — LIPID PANEL
CHOL/HDL RATIO: 2.8 ratio (ref 0.0–4.4)
Cholesterol, Total: 93 mg/dL — ABNORMAL LOW (ref 100–199)
HDL: 33 mg/dL — ABNORMAL LOW (ref >39)
LDL Calculated: 48 mg/dL (ref 0–99)
Triglycerides: 62 mg/dL (ref 0–149)
VLDL Cholesterol Cal: 12 mg/dL (ref 5–40)

## 2018-06-07 DIAGNOSIS — J209 Acute bronchitis, unspecified: Secondary | ICD-10-CM | POA: Diagnosis not present

## 2018-07-03 DIAGNOSIS — Z23 Encounter for immunization: Secondary | ICD-10-CM | POA: Diagnosis not present

## 2018-08-28 DIAGNOSIS — R070 Pain in throat: Secondary | ICD-10-CM | POA: Diagnosis not present

## 2018-11-30 ENCOUNTER — Other Ambulatory Visit: Payer: Self-pay | Admitting: Nurse Practitioner

## 2018-11-30 DIAGNOSIS — I10 Essential (primary) hypertension: Secondary | ICD-10-CM

## 2019-01-18 ENCOUNTER — Telehealth: Payer: Self-pay | Admitting: Nurse Practitioner

## 2019-01-22 ENCOUNTER — Other Ambulatory Visit: Payer: Self-pay | Admitting: Nurse Practitioner

## 2019-01-22 DIAGNOSIS — E782 Mixed hyperlipidemia: Secondary | ICD-10-CM

## 2019-01-22 NOTE — Telephone Encounter (Signed)
Apt scheduled.  

## 2019-01-22 NOTE — Telephone Encounter (Signed)
MMM. NTBS. LOV 05/2018 6 mos was March. RF appoved

## 2019-03-03 ENCOUNTER — Other Ambulatory Visit: Payer: Self-pay | Admitting: Nurse Practitioner

## 2019-03-03 DIAGNOSIS — I10 Essential (primary) hypertension: Secondary | ICD-10-CM

## 2019-03-12 ENCOUNTER — Encounter: Payer: BLUE CROSS/BLUE SHIELD | Admitting: Nurse Practitioner

## 2019-04-01 ENCOUNTER — Other Ambulatory Visit: Payer: Self-pay | Admitting: Nurse Practitioner

## 2019-04-01 DIAGNOSIS — I10 Essential (primary) hypertension: Secondary | ICD-10-CM

## 2019-04-02 DIAGNOSIS — Z1231 Encounter for screening mammogram for malignant neoplasm of breast: Secondary | ICD-10-CM | POA: Diagnosis not present

## 2019-04-18 ENCOUNTER — Other Ambulatory Visit: Payer: Self-pay | Admitting: Nurse Practitioner

## 2019-04-18 DIAGNOSIS — E782 Mixed hyperlipidemia: Secondary | ICD-10-CM

## 2019-04-29 ENCOUNTER — Other Ambulatory Visit: Payer: Self-pay | Admitting: Nurse Practitioner

## 2019-04-29 DIAGNOSIS — I10 Essential (primary) hypertension: Secondary | ICD-10-CM

## 2019-04-30 MED ORDER — FUROSEMIDE 40 MG PO TABS: 40.0000 mg | ORAL_TABLET | Freq: Every day | ORAL | 0 refills | Status: DC

## 2019-04-30 NOTE — Addendum Note (Signed)
Addended by: Wardell Heath on: 04/30/2019 10:12 AM   Modules accepted: Orders

## 2019-04-30 NOTE — Telephone Encounter (Signed)
MMM NTBS 30 days given 04/01/19

## 2019-05-16 ENCOUNTER — Other Ambulatory Visit: Payer: Self-pay | Admitting: Nurse Practitioner

## 2019-05-16 DIAGNOSIS — E782 Mixed hyperlipidemia: Secondary | ICD-10-CM

## 2019-05-21 DIAGNOSIS — N3000 Acute cystitis without hematuria: Secondary | ICD-10-CM | POA: Diagnosis not present

## 2019-06-01 ENCOUNTER — Other Ambulatory Visit: Payer: Self-pay | Admitting: Nurse Practitioner

## 2019-06-01 DIAGNOSIS — I10 Essential (primary) hypertension: Secondary | ICD-10-CM

## 2019-06-08 ENCOUNTER — Other Ambulatory Visit: Payer: Self-pay | Admitting: Nurse Practitioner

## 2019-06-08 DIAGNOSIS — E782 Mixed hyperlipidemia: Secondary | ICD-10-CM

## 2019-06-13 ENCOUNTER — Encounter: Payer: BC Managed Care – PPO | Admitting: Nurse Practitioner

## 2019-06-17 ENCOUNTER — Other Ambulatory Visit: Payer: Self-pay

## 2019-06-18 ENCOUNTER — Encounter: Payer: Self-pay | Admitting: Nurse Practitioner

## 2019-06-18 ENCOUNTER — Ambulatory Visit (INDEPENDENT_AMBULATORY_CARE_PROVIDER_SITE_OTHER): Payer: BC Managed Care – PPO | Admitting: Nurse Practitioner

## 2019-06-18 VITALS — BP 124/86 | HR 76 | Temp 96.2°F | Resp 16 | Ht 63.5 in | Wt 310.0 lb

## 2019-06-18 DIAGNOSIS — E782 Mixed hyperlipidemia: Secondary | ICD-10-CM | POA: Diagnosis not present

## 2019-06-18 DIAGNOSIS — Z23 Encounter for immunization: Secondary | ICD-10-CM

## 2019-06-18 DIAGNOSIS — I10 Essential (primary) hypertension: Secondary | ICD-10-CM

## 2019-06-18 DIAGNOSIS — Z0001 Encounter for general adult medical examination with abnormal findings: Secondary | ICD-10-CM

## 2019-06-18 DIAGNOSIS — Z0000 Encounter for general adult medical examination without abnormal findings: Secondary | ICD-10-CM

## 2019-06-18 MED ORDER — FUROSEMIDE 40 MG PO TABS: 40.0000 mg | ORAL_TABLET | Freq: Every day | ORAL | 0 refills | Status: DC

## 2019-06-18 MED ORDER — LISINOPRIL 10 MG PO TABS: 10.0000 mg | ORAL_TABLET | Freq: Every day | ORAL | 1 refills | Status: DC

## 2019-06-18 MED ORDER — ATORVASTATIN CALCIUM 40 MG PO TABS: 40.0000 mg | ORAL_TABLET | Freq: Every day | ORAL | 1 refills | Status: DC

## 2019-06-18 MED ORDER — NABUMETONE 750 MG PO TABS: 750.0000 mg | ORAL_TABLET | Freq: Two times a day (BID) | ORAL | 1 refills | Status: DC

## 2019-06-18 NOTE — Progress Notes (Signed)
Subjective:    Patient ID: Madison Butler, female    DOB: July 07, 1971, 48 y.o.   MRN: [id number]   Chief Complaint: Annual Exam (No pap)    HPI:  1. Essential hypertension No c/o chest pain, sob or headache. Does not check blood pressure at home. BP Readings from Last 3 Encounters:  05/22/18 99/75  10/17/17 118/62  09/20/16 130/90     2. Mixed hyperlipidemia Does not watch diet and does no exercise. Lab Results  Component Value Date   CHOL 93 (L) 05/22/2018   HDL 33 (L) 05/22/2018   LDLCALC 48 05/22/2018   TRIG 62 05/22/2018   CHOLHDL 2.8 05/22/2018     3. Morbid obesity ([place]) Weight is down 8lbs  Wt Readings from Last 3 Encounters:  06/18/19 (!) 310 lb (140.6 kg)  05/22/18 (!) 318 lb (144.2 kg)  10/17/17 (!) 342 lb (155.1 kg)  '   Outpatient Encounter Medications as of 06/18/2019  Medication Sig  . atorvastatin (LIPITOR) 40 MG tablet TAKE 1 TABLET BY MOUTH EVERY DAY  . furosemide (LASIX) 40 MG tablet Take 1 tablet (40 mg total) by mouth daily.  Marland Kitchen lisinopril (ZESTRIL) 10 MG tablet TAKE 1 TABLET BY MOUTH EVERY DAY  . nabumetone (RELAFEN) 750 MG tablet TAKE 1 TABLET (750 MG TOTAL) BY MOUTH 2 (TWO) TIMES DAILY.   History reviewed. No pertinent surgical history.  Family History  Problem Relation Age of Onset  . Hypertension Mother   . 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  Controlled substance contract: n/a    Review of Systems  Constitutional: Negative for activity change and appetite change.  HENT: Negative.   Eyes: Negative for pain.  Respiratory: Negative for shortness of breath.   Cardiovascular: Negative for chest pain, palpitations and leg swelling.  Gastrointestinal: Negative for abdominal pain.  Endocrine: Negative for polydipsia.  Genitourinary: Negative.   Skin: Negative for rash.  Neurological: Negative for  dizziness, weakness and headaches.  Hematological: Does not bruise/bleed easily.  Psychiatric/Behavioral: Negative.   All other systems reviewed and are negative.      Objective:   Physical Exam Vitals signs and nursing note reviewed.  Constitutional:      General: She is not in acute distress.    Appearance: Normal appearance. She is well-developed.  HENT:     Head: Normocephalic.     Nose: Nose normal.  Eyes:     Pupils: Pupils are equal, round, and reactive to light.  Neck:     Musculoskeletal: Normal range of motion and neck supple.     Vascular: No carotid bruit or JVD.  Cardiovascular:     Rate and Rhythm: Normal rate and regular rhythm.     Heart sounds: Normal heart sounds.  Pulmonary:     Effort: Pulmonary effort is normal. No respiratory distress.     Breath sounds: Normal breath sounds. No wheezing or rales.  Chest:     Chest wall: No tenderness.  Abdominal:     General: Bowel sounds are normal. There is no distension or abdominal bruit.     Palpations: Abdomen is soft. There is no hepatomegaly, splenomegaly, mass or pulsatile mass.     Tenderness: There is no abdominal tenderness.  Musculoskeletal: Normal range of motion.  Lymphadenopathy:     Cervical: No cervical adenopathy.  Skin:    General: Skin is warm and  dry.  Neurological:     Mental Status: She is alert and oriented to person, place, and time.     Deep Tendon Reflexes: Reflexes are normal and symmetric.  Psychiatric:        Behavior: Behavior normal.        Thought Content: Thought content normal.        Judgment: Judgment normal.    BP 124/86   Pulse 76   Temp (!) 96.2 F (35.7 C) (Temporal)   Resp 16   Ht 5' 3.5" (1.613 m)   Wt (!) 310 lb (140.6 kg)   SpO2 94%   BMI 54.05 kg/m          Assessment & Plan:  Madison Butler comes in today with chief complaint of Annual Exam (No pap)   Diagnosis and orders addressed:  1. Annual physical exam  2. Essential hypertension Low  sodium diet - lisinopril (ZESTRIL) 10 MG tablet; Take 1 tablet (10 mg total) by mouth daily.  Dispense: 90 tablet; Refill: 1 - furosemide (LASIX) 40 MG tablet; Take 1 tablet (40 mg total) by mouth daily.  Dispense: 60 tablet; Refill: 0 - CMP14+EGFR - Thyroid Panel With TSH - CBC with Differential/Platelet  3. Mixed hyperlipidemia Low fat diet - atorvastatin (LIPITOR) 40 MG tablet; Take 1 tablet (40 mg total) by mouth daily.  Dispense: 90 tablet; Refill: 1 - Lipid panel  4. Morbid obesity ([place]) Discussed diet and exercise for person with BMI >25 Will recheck weight in 3-6 months   Labs pending Health Maintenance reviewed Diet and exercise encouraged  Follow up plan: 1 year   [place], FNP

## 2019-06-18 NOTE — Patient Instructions (Signed)
Exercising to Stay Healthy To become healthy and stay healthy, it is recommended that you do moderate-intensity and vigorous-intensity exercise. You can tell that you are exercising at a moderate intensity if your heart starts beating faster and you start breathing faster but can still hold a conversation. You can tell that you are exercising at a vigorous intensity if you are breathing much harder and faster and cannot hold a conversation while exercising. Exercising regularly is important. It has many health benefits, such as:  Improving overall fitness, flexibility, and endurance.  Increasing bone density.  Helping with weight control.  Decreasing body fat.  Increasing muscle strength.  Reducing stress and tension.  Improving overall health. How often should I exercise? Choose an activity that you enjoy, and set realistic goals. Your health care provider can help you make an activity plan that works for you. Exercise regularly as told by your health care provider. This may include:  Doing strength training two times a week, such as: ? Lifting weights. ? Using resistance bands. ? Push-ups. ? Sit-ups. ? Yoga.  Doing a certain intensity of exercise for a given amount of time. Choose from these options: ? A total of 150 minutes of moderate-intensity exercise every week. ? A total of 75 minutes of vigorous-intensity exercise every week. ? A mix of moderate-intensity and vigorous-intensity exercise every week. Children, pregnant women, people who have not exercised regularly, people who are overweight, and older adults may need to talk with a health care provider about what activities are safe to do. If you have a medical condition, be sure to talk with your health care provider before you start a new exercise program. What are some exercise ideas? Moderate-intensity exercise ideas include:  Walking 1 mile (1.6 km) in about 15 minutes.  Biking.  Hiking.  Golfing.  Dancing.   Water aerobics. Vigorous-intensity exercise ideas include:  Walking 4.5 miles (7.2 km) or more in about 1 hour.  Jogging or running 5 miles (8 km) in about 1 hour.  Biking 10 miles (16.1 km) or more in about 1 hour.  Lap swimming.  Roller-skating or in-line skating.  Cross-country skiing.  Vigorous competitive sports, such as football, basketball, and soccer.  Jumping rope.  Aerobic dancing. What are some everyday activities that can help me to get exercise?  Yard work, such as: ? Pushing a lawn mower. ? Raking and bagging leaves.  Washing your car.  Pushing a stroller.  Shoveling snow.  Gardening.  Washing windows or floors. How can I be more active in my day-to-day activities?  Use stairs instead of an elevator.  Take a walk during your lunch break.  If you drive, park your car farther away from your work or school.  If you take public transportation, get off one stop early and walk the rest of the way.  Stand up or walk around during all of your indoor phone calls.  Get up, stretch, and walk around every 30 minutes throughout the day.  Enjoy exercise with a friend. Support to continue exercising will help you keep a regular routine of activity. What guidelines can I follow while exercising?  Before you start a new exercise program, talk with your health care provider.  Do not exercise so much that you hurt yourself, feel dizzy, or get very short of breath.  Wear comfortable clothes and wear shoes with good support.  Drink plenty of water while you exercise to prevent dehydration or heat stroke.  Work out until your breathing   and your heartbeat get faster. Where to find more information  U.S. Department of Health and Human Services: www.hhs.gov  Centers for Disease Control and Prevention (CDC): www.cdc.gov Summary  Exercising regularly is important. It will improve your overall fitness, flexibility, and endurance.  Regular exercise also will  improve your overall health. It can help you control your weight, reduce stress, and improve your bone density.  Do not exercise so much that you hurt yourself, feel dizzy, or get very short of breath.  Before you start a new exercise program, talk with your health care provider. This information is not intended to replace advice given to you by your health care provider. Make sure you discuss any questions you have with your health care provider. Document Released: 10/01/2010 Document Revised: 08/11/2017 Document Reviewed: 07/20/2017 Elsevier Patient Education  2020 Elsevier Inc.  

## 2019-06-19 LAB — THYROID PANEL WITH TSH
Free Thyroxine Index: 2.2 (ref 1.2–4.9)
T3 Uptake Ratio: 30 % (ref 24–39)
T4, Total: 7.3 ug/dL (ref 4.5–12.0)
TSH: 2.81 u[IU]/mL (ref 0.450–4.500)

## 2019-06-19 LAB — LIPID PANEL
Chol/HDL Ratio: 2.4 ratio (ref 0.0–4.4)
Cholesterol, Total: 88 mg/dL — ABNORMAL LOW (ref 100–199)
HDL: 37 mg/dL — ABNORMAL LOW (ref >39)
LDL Chol Calc (NIH): 39 mg/dL (ref 0–99)
Triglycerides: 45 mg/dL (ref 0–149)
VLDL Cholesterol Cal: 12 mg/dL (ref 5–40)

## 2019-06-19 LAB — CBC WITH DIFFERENTIAL/PLATELET
Basophils Absolute: 0 10*3/uL (ref 0.0–0.2)
Basos: 0 %
EOS (ABSOLUTE): 0.4 10*3/uL (ref 0.0–0.4)
Eos: 5 %
Hematocrit: 40.3 % (ref 34.0–46.6)
Hemoglobin: 13.6 g/dL (ref 11.1–15.9)
Immature Grans (Abs): 0 10*3/uL (ref 0.0–0.1)
Immature Granulocytes: 0 %
Lymphocytes Absolute: 2.2 10*3/uL (ref 0.7–3.1)
Lymphs: 30 %
MCH: 32.9 pg (ref 26.6–33.0)
MCHC: 33.7 g/dL (ref 31.5–35.7)
MCV: 98 fL — ABNORMAL HIGH (ref 79–97)
Monocytes Absolute: 0.7 10*3/uL (ref 0.1–0.9)
Monocytes: 9 %
Neutrophils Absolute: 4.2 10*3/uL (ref 1.4–7.0)
Neutrophils: 56 %
Platelets: 192 10*3/uL (ref 150–450)
RBC: 4.13 x10E6/uL (ref 3.77–5.28)
RDW: 12.3 % (ref 11.7–15.4)
WBC: 7.5 10*3/uL (ref 3.4–10.8)

## 2019-06-19 LAB — CMP14+EGFR
ALT: 13 IU/L (ref 0–32)
AST: 17 IU/L (ref 0–40)
Albumin/Globulin Ratio: 1.1 — ABNORMAL LOW (ref 1.2–2.2)
Albumin: 3.9 g/dL (ref 3.8–4.8)
Alkaline Phosphatase: 143 IU/L — ABNORMAL HIGH (ref 39–117)
BUN/Creatinine Ratio: 28 — ABNORMAL HIGH (ref 9–23)
BUN: 18 mg/dL (ref 6–24)
Bilirubin Total: 0.7 mg/dL (ref 0.0–1.2)
CO2: 24 mmol/L (ref 20–29)
Calcium: 9.2 mg/dL (ref 8.7–10.2)
Chloride: 104 mmol/L (ref 96–106)
Creatinine, Ser: 0.65 mg/dL (ref 0.57–1.00)
GFR calc Af Amer: 122 mL/min/{1.73_m2} (ref >59)
GFR calc non Af Amer: 106 mL/min/{1.73_m2} (ref >59)
Globulin, Total: 3.7 g/dL (ref 1.5–4.5)
Glucose: 88 mg/dL (ref 65–99)
Potassium: 5 mmol/L (ref 3.5–5.2)
Sodium: 141 mmol/L (ref 134–144)
Total Protein: 7.6 g/dL (ref 6.0–8.5)

## 2019-06-24 DIAGNOSIS — Z6841 Body Mass Index (BMI) 40.0 and over, adult: Secondary | ICD-10-CM | POA: Diagnosis not present

## 2019-06-24 DIAGNOSIS — Z01419 Encounter for gynecological examination (general) (routine) without abnormal findings: Secondary | ICD-10-CM | POA: Diagnosis not present

## 2019-06-25 ENCOUNTER — Other Ambulatory Visit: Payer: Self-pay | Admitting: Nurse Practitioner

## 2019-06-25 DIAGNOSIS — I10 Essential (primary) hypertension: Secondary | ICD-10-CM

## 2019-06-30 ENCOUNTER — Other Ambulatory Visit: Payer: Self-pay | Admitting: Nurse Practitioner

## 2019-07-18 DIAGNOSIS — J069 Acute upper respiratory infection, unspecified: Secondary | ICD-10-CM | POA: Diagnosis not present

## 2019-07-18 DIAGNOSIS — Z03818 Encounter for observation for suspected exposure to other biological agents ruled out: Secondary | ICD-10-CM | POA: Diagnosis not present

## 2019-08-22 DIAGNOSIS — R05 Cough: Secondary | ICD-10-CM | POA: Diagnosis not present

## 2019-08-22 DIAGNOSIS — J209 Acute bronchitis, unspecified: Secondary | ICD-10-CM | POA: Diagnosis not present

## 2019-09-13 DIAGNOSIS — R05 Cough: Secondary | ICD-10-CM | POA: Diagnosis not present

## 2019-09-13 DIAGNOSIS — J209 Acute bronchitis, unspecified: Secondary | ICD-10-CM | POA: Diagnosis not present

## 2019-10-12 DIAGNOSIS — J209 Acute bronchitis, unspecified: Secondary | ICD-10-CM | POA: Diagnosis not present

## 2019-12-11 ENCOUNTER — Other Ambulatory Visit: Payer: Self-pay | Admitting: Nurse Practitioner

## 2019-12-11 DIAGNOSIS — I10 Essential (primary) hypertension: Secondary | ICD-10-CM

## 2019-12-11 DIAGNOSIS — E782 Mixed hyperlipidemia: Secondary | ICD-10-CM

## 2019-12-12 DIAGNOSIS — J029 Acute pharyngitis, unspecified: Secondary | ICD-10-CM | POA: Diagnosis not present

## 2019-12-12 DIAGNOSIS — J209 Acute bronchitis, unspecified: Secondary | ICD-10-CM | POA: Diagnosis not present

## 2019-12-13 DIAGNOSIS — J40 Bronchitis, not specified as acute or chronic: Secondary | ICD-10-CM | POA: Diagnosis not present

## 2019-12-31 ENCOUNTER — Ambulatory Visit (INDEPENDENT_AMBULATORY_CARE_PROVIDER_SITE_OTHER): Payer: BC Managed Care – PPO | Admitting: Internal Medicine

## 2019-12-31 ENCOUNTER — Encounter: Payer: Self-pay | Admitting: Internal Medicine

## 2019-12-31 ENCOUNTER — Other Ambulatory Visit: Payer: Self-pay

## 2019-12-31 DIAGNOSIS — R05 Cough: Secondary | ICD-10-CM

## 2019-12-31 DIAGNOSIS — I10 Essential (primary) hypertension: Secondary | ICD-10-CM | POA: Diagnosis not present

## 2019-12-31 DIAGNOSIS — R058 Other specified cough: Secondary | ICD-10-CM | POA: Insufficient documentation

## 2019-12-31 MED ORDER — PANTOPRAZOLE SODIUM 40 MG PO TBEC: 40.0000 mg | DELAYED_RELEASE_TABLET | Freq: Every day | ORAL | 2 refills | Status: DC

## 2019-12-31 MED ORDER — TELMISARTAN 80 MG PO TABS: 80.0000 mg | ORAL_TABLET | Freq: Every day | ORAL | 11 refills | Status: DC

## 2019-12-31 MED ORDER — FAMOTIDINE 20 MG PO TABS: ORAL_TABLET | ORAL | 11 refills | Status: DC

## 2019-12-31 MED ORDER — PREDNISONE 10 MG PO TABS: ORAL_TABLET | ORAL | 0 refills | Status: DC

## 2019-12-31 NOTE — Assessment & Plan Note (Addendum)
Onset Covid Nov 2020 - d/c'd acei 12/31/2019 and rx short term ppi / pred x 6 d   - The proper method of use, as well as anticipated side effects, of a metered-dose inhaler are discussed and demonstrated to the patient. Improved effectiveness after extensive coaching during this visit to a level of approximately 0 % from a baseline of 0 making asthma less likely here though still in ddx    Comment Upper airway cough syndrome (previously labeled PNDS),  is so named because it's frequently impossible to sort out how much is  CR/sinusitis with freq throat clearing (which can be related to primary GERD)   vs  causing  secondary (" extra esophageal")  GERD from wide swings in gastric pressure that occur with throat clearing, often  promoting self use of mint and menthol lozenges that reduce the lower esophageal sphincter tone and exacerbate the problem further in a cyclical fashion.   These are the same pts (now being labeled as having "irritable larynx syndrome" by some cough centers) who not infrequently have a history of having failed to tolerate ace inhibitors,  dry powder inhalers or biphosphonates or report having atypical/extraesophageal reflux symptoms that don't respond to standard doses of PPI  and are easily confused as having aecopd or asthma flares by even experienced allergists/ pulmonologists (myself included).   >>> try off acei and regroup in 4 weeks on short term gerd rx and also added 6 days of Prednisone in case of component of Th-2 driven upper or lower airways inflammation (if cough responds short term only to relapse befor return while will on rx for uacs that would point to allergic rhinitis/ asthma or eos bronchitis)

## 2019-12-31 NOTE — Assessment & Plan Note (Signed)
D/c acei 12/31/2019 due to pseudowheeze   In the best review of chronic cough to date ( NEJM 2016 375 8676-1950) ,  ACEi are now felt to cause cough in up to  20% of pts which is a 4 fold increase from previous reports and does not include the variety of non-specific complaints we see in pulmonary clinic in pts on ACEi but previously attributed to another dx like  Copd/asthma and  include PNDS, throat and chest congestion, "bronchitis, (unexplained and prev with neg w/u by Dr Juanetta Gosling) , unexplained dyspnea and noct "strangling" sensations, and hoarseness, but also  atypical /refractory GERD symptoms like dysphagia and "bad heartburn"   The only way I know  to prove this is not an "ACEi Case" is a trial off ACEi x a minimum of 4 weeks then regroup.   >>> try micardis 80 mg one half to one daily self titrated

## 2019-12-31 NOTE — Progress Notes (Signed)
Madison Butler, female    DOB: 1971/04/22, 49 y.o.   MRN: [id number]   Brief patient profile:  49 yowf never smoker with HBP eval by Dr Juanetta Gosling for recurrent "bronchitis" around 2011 with nl CT chest  rx inhalers and improved but not maint and since then in fall typically flares and needs prednisone/ zpak  Then covid Nov 2020 with severe cough/ wheezing dx in [place] rx zpak/pred within a week or two back same symptoms so self referred to pulmonary clinic in [place] 12/31/2019    History of Present Illness  12/31/2019  Pulmonary/ 1st office eval/Madison Butler  Chief Complaint  Patient presents with  . Pulmonary Consult    Self referral. Had Covid 01 Aug 2019. Pt c/o cough and wheezing since then. Cough is non prod. She states has to sleep elevated due to cough.   Dyspnea:  MMRC3 = can't walk 100 yards even at a slow pace at a flat grade s stopping due to sob   Cough/wheeze worse at hs  Sleep 3 pillows flat bed x  One week worse than usual at hs wakes up but sometimes better even s saba  SABA use: hfa: hasnot used x 2 weeks/ very poor hfa trigger/ neb helps more but rarely uses.  No obvious patterns now to day to day or daytime variability or assoc excess/ purulent sputum or mucus plugs or hemoptysis or cp or chest tightness,   or overt sinus or hb symptoms.     Also denies any obvious fluctuation of symptoms with weather or environmental changes or other aggravating or alleviating factors except as outlined above   No unusual exposure hx or h/o childhood pna/ asthma or knowledge of premature birth.  Current Allergies, Complete Past Medical History, Past Surgical History, Family History, and Social History were reviewed in [employer] record.  ROS  The following are not active complaints unless bolded Hoarseness, sore throat, dysphagia, dental problems, itching, sneezing,  nasal congestion or discharge of excess mucus or purulent secretions, ear ache,   fever, chills,  sweats, unintended wt loss or wt gain, classically pleuritic or exertional cp,  orthopnea pnd or arm/hand swelling  or leg swelling, presyncope, palpitations, abdominal pain, anorexia, nausea, vomiting, diarrhea  or change in bowel habits or change in bladder habits, change in stools or change in urine, dysuria, hematuria,  rash, arthralgias, visual complaints, headache, numbness, weakness or ataxia or problems with walking or coordination,  change in mood or  memory.             Past Medical History:  Diagnosis Date  . Hyperlipidemia   . Obesity     Outpatient Medications Prior to Visit  Medication Sig Dispense Refill  . albuterol (VENTOLIN HFA) 108 (90 Base) MCG/ACT inhaler Inhale 2 puffs into the lungs every 6 (six) hours as needed for wheezing or shortness of breath.    Marland Kitchen atorvastatin (LIPITOR) 40 MG tablet Take 1 tablet (40 mg total) by mouth daily at 6 PM. Needs to be seen before next refill. 90 tablet 0  . furosemide (LASIX) 40 MG tablet Take 1 tablet (40 mg total) by mouth daily. Needs to be seen before next refill 90 tablet 0  . lisinopril (ZESTRIL) 10 MG tablet Take 1 tablet (10 mg total) by mouth daily. Needs to be seen before next refill. 90 tablet 0  . nabumetone (RELAFEN) 750 MG tablet Take 1 tablet (750 mg total) by mouth 2 (two) times daily. 180 tablet 1  No facility-administered medications prior to visit.     Objective:     BP 132/80 (BP Location: Left Arm, Cuff Size: Large)   Pulse 77   Temp (!) 97.4 F (36.3 C) (Temporal)   Ht 5\' 4"  (1.626 m)   Wt (!) 310 lb (140.6 kg) Comment: per pt  SpO2 99% Comment: on RA  BMI 53.21 kg/m   SpO2: 99 %(on RA)   amb wf with prominent pseudowheeze    HEENT : pt wearing mask not removed for exam due to covid -19 concerns.    NECK :  without JVD/Nodes/TM/ nl carotid upstrokes bilaterally   LUNGS: no acc muscle use,  Nl contour chest distant bs bilaterally and mostly transmitted upper airway wheeze without cough on  insp or exp maneuvers   CV:  RRR  no s3 or murmur or increase in P2, and  Trace edema both LE's after skipping am lasix  ABD:  Quite obese soft and nontender with nl inspiratory excursion in the supine position. No bruits or organomegaly appreciated, bowel sounds nl  MS:  Nl gait/ ext warm without deformities, calf tenderness, cyanosis or clubbing No obvious joint restrictions   SKIN: warm and dry without lesions    NEURO:  alert, approp, nl sensorium with  no motor or cerebellar deficits apparent.    cxr at last ov 2 weeks prior to OV  "ok" per pt report        Assessment   Upper airway cough syndrome Onset Covid Nov 2020 - d/c'd acei 12/31/2019 and rx short term ppi / pred x 6 d   - The proper method of use, as well as anticipated side effects, of a metered-dose inhaler are discussed and demonstrated to the patient. Improved effectiveness after extensive coaching during this visit to a level of approximately 0 % from a baseline of 0 making asthma less likely here though still in ddx    Comment Upper airway cough syndrome (previously labeled PNDS),  is so named because it's frequently impossible to sort out how much is  CR/sinusitis with freq throat clearing (which can be related to primary GERD)   vs  causing  secondary (" extra esophageal")  GERD from wide swings in gastric pressure that occur with throat clearing, often  promoting self use of mint and menthol lozenges that reduce the lower esophageal sphincter tone and exacerbate the problem further in a cyclical fashion.   These are the same pts (now being labeled as having "irritable larynx syndrome" by some cough centers) who not infrequently have a history of having failed to tolerate ace inhibitors,  dry powder inhalers or biphosphonates or report having atypical/extraesophageal reflux symptoms that don't respond to standard doses of PPI  and are easily confused as having aecopd or asthma flares by even experienced allergists/  pulmonologists (myself included).   >>> try off acei and regroup in 4 weeks on short term gerd rx and also added 6 days of Prednisone in case of component of Th-2 driven upper or lower airways inflammation (if cough responds short term only to relapse befor return while will on rx for uacs that would point to allergic rhinitis/ asthma or eos bronchitis)       Essential hypertension D/c acei 12/31/2019 due to pseudowheeze   In the best review of chronic cough to date ( NEJM 2016 375 1544-1551) ,  ACEi are now felt to cause cough in up to  20% of pts which is a 4 fold increase  from previous reports and does not include the variety of non-specific complaints we see in pulmonary clinic in pts on ACEi but previously attributed to another dx like  Copd/asthma and  include PNDS, throat and chest congestion, "bronchitis, (unexplained and prev with neg w/u by Dr Luan Pulling) , unexplained dyspnea and noct "strangling" sensations, and hoarseness, but also  atypical /refractory GERD symptoms like dysphagia and "bad heartburn"   The only way I know  to prove this is not an "ACEi Case" is a trial off ACEi x a minimum of 4 weeks then regroup.   >>> try micardis 80 mg one half to one daily self titrated     Morbid obesity ([place]) Body mass index is 53.21 kg/m.  -   Lab Results  Component Value Date   TSH 2.810 06/18/2019     Contributing to gerd risk/ doe/reviewed the need and the process to achieve and maintain neg calorie balance > defer f/u primary care including intermittently monitoring thyroid status.        Each maintenance medication was reviewed in detail including emphasizing most importantly the difference between maintenance and prns and under what circumstances the prns are to be triggered using an action plan format where appropriate.  Total time for H and P, chart review, counseling, teaching device and generating customized AVS unique to this office visit / charting = 60 min               Christinia Gully, MD 12/31/2019

## 2019-12-31 NOTE — Patient Instructions (Addendum)
Stop lisinopril and start micardis (telmesartan) 80 mg one half  daily in its place   Work on inhaler technique:  relax and gently blow all the way out then take a nice smooth deep breath back in, triggering the inhaler at same time you start breathing in.  Blow out thru nose. Rinse and gargle with water when done.  Only use your albuterol as a rescue medication to be used if you can't catch your breath by resting or doing a relaxed purse lip breathing pattern.  - The less you use it, the better it will work when you need it. - Ok to use up to 2 puffs  every 4 hours if you must but call for immediate appointment if use goes up over your usual need - Don't leave home without it !!  (think of it like the spare tire for your car)   Prednisone 10 mg take  4 each am x 2 days,   2 each am x 2 days,  1 each am x 2 days and stop   Pantoprazole (protonix) 40 mg   Take  30-60 min before first meal of the day and Pepcid (famotidine)  20 mg one after supper  until return to office - this is the best way to tell whether stomach acid is contributing to your problem.     GERD (REFLUX)  is an extremely common cause of respiratory symptoms just like yours , many times with no obvious heartburn at all.    It can be treated with medication, but also with lifestyle changes including elevation of the head of your bed (ideally with 6 -8inch blocks under the headboard of your bed),  Smoking cessation, avoidance of late meals, excessive alcohol, and avoid fatty foods, chocolate, peppermint, colas, red wine, and acidic juices such as orange juice.  NO MINT OR MENTHOL PRODUCTS SO NO COUGH DROPS  USE SUGARLESS CANDY INSTEAD (Jolley ranchers or Stover's or Life Savers) or even ice chips will also do - the key is to swallow to prevent all throat clearing. NO OIL BASED VITAMINS - use powdered substitutes.  Avoid fish oil when coughing.   Please schedule a follow up office visit in 4 weeks, sooner if needed

## 2019-12-31 NOTE — Assessment & Plan Note (Signed)
Body mass index is 53.21 kg/m.  -   Lab Results  Component Value Date   TSH 2.810 06/18/2019     Contributing to gerd risk/ doe/reviewed the need and the process to achieve and maintain neg calorie balance > defer f/u primary care including intermittently monitoring thyroid status.         Each maintenance medication was reviewed in detail including emphasizing most importantly the difference between maintenance and prns and under what circumstances the prns are to be triggered using an action plan format where appropriate.  Total time for H and P, chart review, counseling, teaching device and generating customized AVS unique to this office visit / charting = 60 min

## 2020-01-28 ENCOUNTER — Ambulatory Visit: Payer: BC Managed Care – PPO | Admitting: Internal Medicine

## 2020-01-28 ENCOUNTER — Encounter: Payer: Self-pay | Admitting: Internal Medicine

## 2020-01-28 ENCOUNTER — Other Ambulatory Visit: Payer: Self-pay

## 2020-01-28 DIAGNOSIS — I10 Essential (primary) hypertension: Secondary | ICD-10-CM

## 2020-01-28 DIAGNOSIS — R05 Cough: Secondary | ICD-10-CM | POA: Diagnosis not present

## 2020-01-28 DIAGNOSIS — R0600 Dyspnea, unspecified: Secondary | ICD-10-CM | POA: Insufficient documentation

## 2020-01-28 DIAGNOSIS — R058 Other specified cough: Secondary | ICD-10-CM

## 2020-01-28 DIAGNOSIS — R0609 Other forms of dyspnea: Secondary | ICD-10-CM | POA: Insufficient documentation

## 2020-01-28 MED ORDER — DULERA 100-5 MCG/ACT IN AERO: INHALATION_SPRAY | RESPIRATORY_TRACT | 2 refills | Status: DC

## 2020-01-28 NOTE — Assessment & Plan Note (Signed)
Onset Covid Nov 2020 - d/c'd acei 12/31/2019 and rx short term ppi / pred x 6 d > much better but still "wheezing" 01/28/2020 > trial of dulera 100 2bid and continued acei off/ on gerd rx  Response to prednisone then relapse suggests component of cough variant asthma, eos rhinitis with pnds or eos bronchitis > try duelra 100 2bid then regroup in 6 weeks with all meds in hand using a trust but verify approach to confirm accurate Medication  Reconciliation The principal here is that until we are certain that the  patients are doing what we've asked, it makes no sense to ask them to do more.

## 2020-01-28 NOTE — Assessment & Plan Note (Signed)
Trial off acei 12/31/19 for pseudowheeze > improved some 01/28/2020    Although even in retrospect it may not be clear the ACEi contributed to the pt's symptoms,  Pt improved off them and adding them back at this point or in the future would risk confusion in interpretation of non-specific respiratory symptoms to which this patient is prone  ie  Better not to muddy the waters here.   >>> continue micardis 80 mg daily / diuretic rx per PCP

## 2020-01-28 NOTE — Patient Instructions (Addendum)
Plan A = Automatic = Always=    Dulera 100 Take 2 puffs first thing in am and then another 2 puffs about 12 hours later.     Work on inhaler technique:  relax and gently blow all the way out then take a nice smooth deep breath back in, triggering the inhaler at same time you start breathing in.  Hold for up to 5 seconds if you can. Blow out thru nose. Rinse and gargle with water when done     Plan B = Backup (to supplement plan A, not to replace it) Only use your albuterol inhaler as a rescue medication to be used if you can't catch your breath by resting or doing a relaxed purse lip breathing pattern.  - The less you use it, the better it will work when you need it. - Ok to use the inhaler up to 2 puffs  every 4 hours if you must but call for appointment if use goes up over your usual need - Don't leave home without it !!  (think of it like the spare tire for your car)     No other medication changes for now but try regular paced walking up to 30 min daily    Please schedule a follow up office visit in 4 weeks, sooner if needed  with all medications /inhalers/ solutions in hand so we can verify exactly what you are taking. This includes all medications from all doctors and over the counters

## 2020-01-28 NOTE — Assessment & Plan Note (Addendum)
Body mass index is 53.21 kg/m.  -  trending no change  Lab Results  Component Value Date   TSH 2.810 06/18/2019     Contributing to gerd risk/ doe/reviewed the need and the process to achieve and maintain neg calorie balance > defer f/u primary care including intermittently monitoring thyroid status

## 2020-01-28 NOTE — Progress Notes (Signed)
Madison [place], female    DOB: [birth year], 49 y.o.   MRN: [id number]   Brief patient profile:  49 yowf never smoker with HBP eval by Dr Juanetta Gosling for recurrent "bronchitis" around 2011 with nl CT chest  rx short term inhalers and improved but not on maint and since then in fall typically flares and needs prednisone/ zpak  Then covid Nov 2020 with severe cough/ wheezing dx in [place] rx zpak/pred within a week or two back same symptoms so self referred to pulmonary clinic in [place] 12/31/2019    History of Present Illness  12/31/2019  Pulmonary/ 1st office eval/Hridaan Bouse  Chief Complaint  Patient presents with  . Pulmonary Consult    Self referral. Had Covid 01 Aug 2019. Pt c/o cough and wheezing since then. Cough is non prod. She states has to sleep elevated due to cough.   Dyspnea:  MMRC3 = can't walk 100 yards even at a slow pace at a flat grade s stopping due to sob   Cough/wheeze worse at hs  Sleep 3 pillows flat bed x  One week worse than usual at hs wakes up but sometimes better even s saba  SABA use: hfa: hasnot used x 2 weeks/ very poor hfa trigger/ neb helps more but rarely uses rec Stop lisinopril and start micardis (telmesartan) 80 mg one half  daily in its place  Work on inhaler technique:    Only use your albuterol as a rescue medication  Prednisone 10 mg take  4 each am x 2 days,   2 each am x 2 days,  1 each am x 2 days and stop  Pantoprazole (protonix) 40 mg   Take  30-60 min before first meal of the day and Pepcid (famotidine)  20 mg one after supper   GERD diet    01/28/2020  f/u ov/Ladell Lea re:  Chief Complaint  Patient presents with  . Follow-up    Cough and wheezing had improved and then worsened again over the past wk.    Dyspnea:  Really much better, able grocery shopping / cross parking lot  Cough:  Some in day x last week:   Sleeping: wakes with wheezing once a night/ 2 pillows / does not use saba at hs  SABA use: none 02: none  Swelling a bit worse    No  obvious day to day or daytime variability or assoc excess/ purulent sputum or mucus plugs or hemoptysis or cp or chest tightness, subjective wheeze or overt sinus or hb symptoms.   slee without nocturnal  or early am exacerbation  of respiratory  c/o's or need for noct saba. Also denies any obvious fluctuation of symptoms with weather or environmental changes or other aggravating or alleviating factors except as outlined above   No unusual exposure hx or h/o childhood pna/ asthma or knowledge of premature birth.  Current Allergies, Complete Past Medical History, Past Surgical History, Family History, and Social History were reviewed in [employer] record.  ROS  The following are not active complaints unless bolded Hoarseness, sore throat, dysphagia, dental problems, itching, sneezing,  nasal congestion or discharge of excess mucus or purulent secretions, ear ache,   fever, chills, sweats, unintended wt loss or wt gain, classically pleuritic or exertional cp,  orthopnea pnd or arm/hand swelling  or leg swelling, presyncope, palpitations, abdominal pain, anorexia, nausea, vomiting, diarrhea  or change in bowel habits or change in bladder habits, change in stools or change in urine, dysuria, hematuria,  rash, arthralgias, visual complaints, headache, numbness, weakness or ataxia or problems with walking or coordination,  change in mood or  memory.        Current Meds  Medication Sig  . albuterol (VENTOLIN HFA) 108 (90 Base) MCG/ACT inhaler Inhale 2 puffs into the lungs every 6 (six) hours as needed for wheezing or shortness of breath.  Marland Kitchen atorvastatin (LIPITOR) 40 MG tablet Take 1 tablet (40 mg total) by mouth daily at 6 PM. Needs to be seen before next refill.  . famotidine (PEPCID) 20 MG tablet One after supper  . furosemide (LASIX) 40 MG tablet Take 1 tablet (40 mg total) by mouth daily. Needs to be seen before next refill  . nabumetone (RELAFEN) 750 MG tablet Take 1 tablet  (750 mg total) by mouth 2 (two) times daily.  . pantoprazole (PROTONIX) 40 MG tablet Take 1 tablet (40 mg total) by mouth daily. Take 30-60 min before first meal of the day  . telmisartan (MICARDIS) 80 MG tablet Take 1 tablet (80 mg total) by mouth daily.              Objective:    Wt Readings from Last 3 Encounters:  01/28/20 (!) 310 lb (140.6 kg)  12/31/19 (!) 310 lb (140.6 kg)  06/18/19 (!) 310 lb (140.6 kg)     Vital signs reviewed - Note on arrival 02 sats  97% on RAand BP 126/74   Obese wf prominent pseudowheeze better with PLM    HEENT : pt wearing mask not removed for exam due to covid -19 concerns.    NECK :  without JVD/Nodes/TM/ nl carotid upstrokes bilaterally   LUNGS: no acc muscle use,  Nl contour chest which is clear to A and P bilaterally without cough on insp or exp maneuvers   CV:  RRR  no s3 or murmur or increase in P2, and 1+ pitting both LE  ABD:  soft and nontender with nl inspiratory excursion in the supine position. No bruits or organomegaly appreciated, bowel sounds nl  MS:  Nl gait/ ext warm without deformities, calf tenderness, cyanosis or clubbing No obvious joint restrictions   SKIN: warm and dry without lesions    NEURO:  alert, approp, nl sensorium with  no motor or cerebellar deficits apparent.            Assessment

## 2020-01-28 NOTE — Assessment & Plan Note (Signed)
Onset Nov 2020 with covid 19 - 01/28/2020   Walked on RA  X 300 ft @ nl pace stopped due to end of study,  No sob/ no desats  >>> strongly rec more regular submaximal walking, up to 30 min daily, to help with conditioning and neg cal balance         Each maintenance medication was reviewed in detail including emphasizing most importantly the difference between maintenance and prns and under what circumstances the prns are to be triggered using an action plan format where appropriate.  Total time for H and P, chart review, counseling, teaching device/  directly observing portions of ambulatory 02 saturation study/  and generating customized AVS unique to this office visit / charting = 30 min

## 2020-02-25 ENCOUNTER — Other Ambulatory Visit: Payer: Self-pay

## 2020-02-25 ENCOUNTER — Encounter: Payer: Self-pay | Admitting: Internal Medicine

## 2020-02-25 ENCOUNTER — Ambulatory Visit: Payer: BC Managed Care – PPO | Admitting: Internal Medicine

## 2020-02-25 DIAGNOSIS — R058 Other specified cough: Secondary | ICD-10-CM

## 2020-02-25 DIAGNOSIS — I10 Essential (primary) hypertension: Secondary | ICD-10-CM | POA: Diagnosis not present

## 2020-02-25 DIAGNOSIS — R05 Cough: Secondary | ICD-10-CM | POA: Diagnosis not present

## 2020-02-25 MED ORDER — PANTOPRAZOLE SODIUM 40 MG PO TBEC: 40.0000 mg | DELAYED_RELEASE_TABLET | Freq: Every day | ORAL | 5 refills | Status: DC

## 2020-02-25 NOTE — Patient Instructions (Addendum)
To get the most out of exercise, you need to be continuously aware that you are short of breath, but never out of breath, for 30 minutes daily. As you improve, it will actually be easier for you to do the same amount of exercise  in  30 minutes so always push to the level where you are short of breath.   Increase micardis 80 mg one daily   If doing great with breathing ok to reduce dulera 100 to one twice daily   I strongly recommend the covid 19 vaccination which is both very safe and very effectiveness   Please schedule a follow up visit in 6 months but call sooner if needed  Late add rec cynder blocks under hob

## 2020-02-25 NOTE — Assessment & Plan Note (Addendum)
Body mass index is 54.41 kg/m.  -  trending up  Lab Results  Component Value Date   TSH 2.810 06/18/2019     Contributing to gerd risk/ doe/reviewed the need and the process to achieve and maintain neg calorie balance > defer f/u primary care including intermittently monitoring thyroid status            Each maintenance medication was reviewed in detail including emphasizing most importantly the difference between maintenance and prns and under what circumstances the prns are to be triggered using an action plan format where appropriate.  Total time for H and P, chart review, counseling, teaching device and generating customized AVS unique to this office visit / charting = 30 min

## 2020-02-25 NOTE — Assessment & Plan Note (Signed)
Trial off acei 12/31/19 for pseudowheeze > improved some 01/28/2020 > even better 02/25/2020   rec avoid acei indefinitely, use micardis 80 mg one whole tablet daily and lasix prn swelling

## 2020-02-25 NOTE — Assessment & Plan Note (Addendum)
Onset Covid Nov 2020 - d/c'd acei 12/31/2019 and rx short term ppi / pred x 6 d > much better but still "wheezing" 01/28/2020 > trial of dulera 100 2bid and continued acei off/ on gerd rx - 02/25/2020  After extensive coaching inhaler device,  effectiveness =    90%   If this is asthma >  All goals of chronic asthma control met including optimal function and elimination of symptoms with minimal need for rescue therapy.  Contingencies discussed in full including contacting this office immediately if not controlling the symptoms using the rule of two's.     If continues to do so well off acei ok to use dulera 1-2 bid - Based on two studies from NEJM  378; 20 p 1865 (2018) and 380 : p2020-30 (2019) in pts with mild asthma it is reasonable to use low dose symbicort eg 80 (and by extrapolation dule 100 since has same laba) 1-2bid "prn" flare in this setting but I emphasized this was only shown with symbicort and takes advantage of the rapid onset of action but is not the same as "rescue therapy" but can be stopped once the acute symptoms have resolved and the need for rescue has been minimized (< 2 x weekly)

## 2020-02-25 NOTE — Progress Notes (Signed)
Madison [place], female    DOB: [birth year], 49 y.o.   MRN: [id number]   Brief patient profile:  49 yowf never smoker with HBP eval by Dr Juanetta Gosling for recurrent "bronchitis" around 2011 with nl CT chest  rx short term inhalers and improved but not on maint and since then in fall typically flares and needs prednisone/ zpak  Then covid Nov 2020 with severe cough/ wheezing dx in [place] rx zpak/pred within a week or two back same symptoms so self referred to pulmonary clinic in [place] 12/31/2019    History of Present Illness  12/31/2019  Pulmonary/ 1st office eval/Othal Kubitz  Chief Complaint  Patient presents with  . Pulmonary Consult    Self referral. Had Covid 01 Aug 2019. Pt c/o cough and wheezing since then. Cough is non prod. She states has to sleep elevated due to cough.   Dyspnea:  MMRC3 = can't walk 100 yards even at a slow pace at a flat grade s stopping due to sob   Cough/wheeze worse at hs  Sleep 3 pillows flat bed x  One week worse than usual at hs wakes up but sometimes better even s saba  SABA use: hfa: hasnot used x 2 weeks/ very poor hfa trigger/ neb helps more but rarely uses rec Stop lisinopril and start micardis (telmesartan) 80 mg one half  daily in its place  Work on inhaler technique:    Only use your albuterol as a rescue medication  Prednisone 10 mg take  4 each am x 2 days,   2 each am x 2 days,  1 each am x 2 days and stop  Pantoprazole (protonix) 40 mg   Take  30-60 min before first meal of the day and Pepcid (famotidine)  20 mg one after supper   GERD diet    01/28/2020  f/u ov/Trimaine Maser re:  Chief Complaint  Patient presents with  . Follow-up    Cough and wheezing had improved and then worsened again over the past wk.    Dyspnea:  Really much better, able grocery shopping / cross parking lot  Cough:  Some in day x last week:   Sleeping: wakes with wheezing once a night/ 2 pillows / does not use saba at hs  SABA use: none 02: none  Swelling a bit worse   rec Plan A = Automatic = Always=    Dulera 100 Take 2 puffs first thing in am and then another 2 puffs about 12 hours later.  Work on inhaler technique:   Plan B = Backup (to supplement plan A, not to replace it) Only use your albuterol inhaler as a rescue medication  No other medication changes for now but try regular paced walking up to 30 min daily  Please schedule a follow up office visit in 4 weeks, sooner if needed  with all medications /inhalers/ solutions in hand   02/25/2020  f/u ov/Adynn Caseres re: uacs vs asthma / hbp on micardis 80 mg one half daily  Chief Complaint  Patient presents with  . Follow-up    Breathing has improved and she is coughing less since the last visit. She has not used her albuterol inhaler.   Dyspnea: grocery store / parking lot easier, no regular walking  Cough: once a week  Sleeping: not longer waking /  SABA use: rarely  02: none    No obvious day to day or daytime variability or assoc excess/ purulent sputum or mucus plugs or hemoptysis or cp or  chest tightness, subjective wheeze or overt sinus or hb symptoms.   Sleeping  without nocturnal  or early am exacerbation  of respiratory  c/o's or need for noct saba. Also denies any obvious fluctuation of symptoms with weather or environmental changes or other aggravating or alleviating factors except as outlined above   No unusual exposure hx or h/o childhood pna/ asthma or knowledge of premature birth.  Current Allergies, Complete Past Medical History, Past Surgical History, Family History, and Social History were reviewed in [employer] record.  ROS  The following are not active complaints unless bolded Hoarseness, sore throat, dysphagia, dental problems, itching, sneezing,  nasal congestion or discharge of excess mucus or purulent secretions, ear ache,   fever, chills, sweats, unintended wt loss or wt gain, classically pleuritic or exertional cp,  orthopnea pnd or arm/hand swelling   or leg swelling, presyncope, palpitations, abdominal pain, anorexia, nausea, vomiting, diarrhea  or change in bowel habits or change in bladder habits, change in stools or change in urine, dysuria, hematuria,  rash, arthralgias, visual complaints, headache, numbness, weakness or ataxia or problems with walking or coordination,  change in mood or  memory.        Current Meds  Medication Sig  . albuterol (VENTOLIN HFA) 108 (90 Base) MCG/ACT inhaler Inhale 2 puffs into the lungs every 6 (six) hours as needed for wheezing or shortness of breath.  Marland Kitchen atorvastatin (LIPITOR) 40 MG tablet Take 1 tablet (40 mg total) by mouth daily at 6 PM. Needs to be seen before next refill.  . famotidine (PEPCID) 20 MG tablet One after supper  . furosemide (LASIX) 40 MG tablet Take 1 tablet (40 mg total) by mouth daily. Needs to be seen before next refill  . mometasone-formoterol (DULERA) 100-5 MCG/ACT AERO dulera 100 Take 2 puffs first thing in am and then another 2 puffs about 12 hours later.  . nabumetone (RELAFEN) 750 MG tablet Take 1 tablet (750 mg total) by mouth 2 (two) times daily.  . pantoprazole (PROTONIX) 40 MG tablet Take 1 tablet (40 mg total) by mouth daily. Take 30-60 min before first meal of the day  . telmisartan (MICARDIS) 80 MG tablet Take one half daily                 Objective:    02/25/2020           317 *  01/28/20 (!) 310 lb (140.6 kg)  12/31/19 (!) 310 lb (140.6 kg)  06/18/19 (!) 310 lb (140.6 kg)    amb obese wf nad/ minimal pseudowheeze    HEENT : pt wearing mask not removed for exam due to covid -19 concerns.    NECK :  without JVD/Nodes/TM/ nl carotid upstrokes bilaterally   LUNGS: no acc muscle use,  Nl contour chest which is clear to A and P bilaterally without cough on insp or exp maneuvers   CV:  RRR  no s3 or murmur or increase in P2, and trace bilteral LD  edema   ABD:  Massively obese soft and nontender with  limited inspiratory excursion . No bruits or  organomegaly appreciated, bowel sounds nl  MS:  Nl gait/ ext warm without deformities, calf tenderness, cyanosis or clubbing No obvious joint restrictions   SKIN: warm and dry without lesions    NEURO:  alert, approp, nl sensorium with  no motor or cerebellar deficits apparent.  Assessment

## 2020-03-24 ENCOUNTER — Other Ambulatory Visit: Payer: Self-pay | Admitting: Nurse Practitioner

## 2020-03-24 DIAGNOSIS — I10 Essential (primary) hypertension: Secondary | ICD-10-CM

## 2020-03-25 NOTE — Telephone Encounter (Signed)
Last OV 06/18/2019. Last RF 30 day supply given today . Next OV not scheduled  Needs appointment for further refills

## 2020-03-29 DIAGNOSIS — Z6841 Body Mass Index (BMI) 40.0 and over, adult: Secondary | ICD-10-CM | POA: Diagnosis not present

## 2020-03-29 DIAGNOSIS — R062 Wheezing: Secondary | ICD-10-CM | POA: Diagnosis not present

## 2020-03-29 DIAGNOSIS — R0989 Other specified symptoms and signs involving the circulatory and respiratory systems: Secondary | ICD-10-CM | POA: Diagnosis not present

## 2020-03-29 DIAGNOSIS — R05 Cough: Secondary | ICD-10-CM | POA: Diagnosis not present

## 2020-05-12 ENCOUNTER — Other Ambulatory Visit: Payer: Self-pay | Admitting: Nurse Practitioner

## 2020-05-12 DIAGNOSIS — I10 Essential (primary) hypertension: Secondary | ICD-10-CM

## 2020-05-12 NOTE — Telephone Encounter (Signed)
Appointment scheduled.

## 2020-05-12 NOTE — Telephone Encounter (Signed)
MMM NTBS 30 days given 03/25/20

## 2020-05-22 ENCOUNTER — Other Ambulatory Visit: Payer: Self-pay | Admitting: Nurse Practitioner

## 2020-05-22 DIAGNOSIS — J039 Acute tonsillitis, unspecified: Secondary | ICD-10-CM | POA: Diagnosis not present

## 2020-05-22 DIAGNOSIS — Z6841 Body Mass Index (BMI) 40.0 and over, adult: Secondary | ICD-10-CM | POA: Diagnosis not present

## 2020-05-22 DIAGNOSIS — E782 Mixed hyperlipidemia: Secondary | ICD-10-CM

## 2020-05-22 DIAGNOSIS — J029 Acute pharyngitis, unspecified: Secondary | ICD-10-CM | POA: Diagnosis not present

## 2020-05-30 DIAGNOSIS — J069 Acute upper respiratory infection, unspecified: Secondary | ICD-10-CM | POA: Diagnosis not present

## 2020-06-22 ENCOUNTER — Ambulatory Visit (INDEPENDENT_AMBULATORY_CARE_PROVIDER_SITE_OTHER): Payer: BC Managed Care – PPO | Admitting: Family Medicine

## 2020-06-22 ENCOUNTER — Encounter: Payer: Self-pay | Admitting: Family Medicine

## 2020-06-22 ENCOUNTER — Other Ambulatory Visit: Payer: Self-pay

## 2020-06-22 VITALS — BP 120/83 | HR 73 | Temp 98.0°F | Ht 64.0 in | Wt 316.0 lb

## 2020-06-22 DIAGNOSIS — M1710 Unilateral primary osteoarthritis, unspecified knee: Secondary | ICD-10-CM

## 2020-06-22 DIAGNOSIS — R058 Other specified cough: Secondary | ICD-10-CM

## 2020-06-22 DIAGNOSIS — E782 Mixed hyperlipidemia: Secondary | ICD-10-CM | POA: Diagnosis not present

## 2020-06-22 DIAGNOSIS — I10 Essential (primary) hypertension: Secondary | ICD-10-CM

## 2020-06-22 DIAGNOSIS — Z23 Encounter for immunization: Secondary | ICD-10-CM | POA: Diagnosis not present

## 2020-06-22 DIAGNOSIS — E559 Vitamin D deficiency, unspecified: Secondary | ICD-10-CM | POA: Diagnosis not present

## 2020-06-22 DIAGNOSIS — Z114 Encounter for screening for human immunodeficiency virus [HIV]: Secondary | ICD-10-CM | POA: Diagnosis not present

## 2020-06-22 DIAGNOSIS — Z0000 Encounter for general adult medical examination without abnormal findings: Secondary | ICD-10-CM | POA: Diagnosis not present

## 2020-06-22 DIAGNOSIS — Z0001 Encounter for general adult medical examination with abnormal findings: Secondary | ICD-10-CM

## 2020-06-22 DIAGNOSIS — K219 Gastro-esophageal reflux disease without esophagitis: Secondary | ICD-10-CM

## 2020-06-22 DIAGNOSIS — Z1159 Encounter for screening for other viral diseases: Secondary | ICD-10-CM

## 2020-06-22 DIAGNOSIS — M7730 Calcaneal spur, unspecified foot: Secondary | ICD-10-CM

## 2020-06-22 MED ORDER — PANTOPRAZOLE SODIUM 40 MG PO TBEC: 40.0000 mg | DELAYED_RELEASE_TABLET | Freq: Every day | ORAL | 3 refills | Status: DC

## 2020-06-22 MED ORDER — ATORVASTATIN CALCIUM 40 MG PO TABS: ORAL_TABLET | ORAL | 3 refills | Status: DC

## 2020-06-22 MED ORDER — FAMOTIDINE 20 MG PO TABS: ORAL_TABLET | ORAL | 3 refills | Status: DC

## 2020-06-22 MED ORDER — FUROSEMIDE 40 MG PO TABS: 40.0000 mg | ORAL_TABLET | Freq: Every day | ORAL | 1 refills | Status: DC

## 2020-06-22 MED ORDER — DULERA 100-5 MCG/ACT IN AERO: INHALATION_SPRAY | RESPIRATORY_TRACT | 2 refills | Status: AC

## 2020-06-22 MED ORDER — TELMISARTAN 80 MG PO TABS: 80.0000 mg | ORAL_TABLET | Freq: Every day | ORAL | 1 refills | Status: DC

## 2020-06-22 MED ORDER — ALBUTEROL SULFATE HFA 108 (90 BASE) MCG/ACT IN AERS: 2.0000 | INHALATION_SPRAY | Freq: Four times a day (QID) | RESPIRATORY_TRACT | 11 refills | Status: AC | PRN

## 2020-06-22 MED ORDER — NABUMETONE 750 MG PO TABS: 750.0000 mg | ORAL_TABLET | Freq: Two times a day (BID) | ORAL | 1 refills | Status: DC

## 2020-06-22 NOTE — Progress Notes (Signed)
Subjective:  Patient ID: Madison Butler, female    DOB: 11-10-1970  Age: 49 y.o. MRN: [id number]  CC: Annual Exam   HPI [name] presents for annual physical examination.  She has been with Dr. Barrie Dunker in [place] who recently retired.  We told her that she will next be due for a Pap smear in January 2023.  She had Covid in November 2020 and developed wheezing after that.  She has been seeing Dr. Shyrl Numbers of pulmonology who diagnosed her with GERD.  He prescribed Pepcid and Protonix and her symptoms have essentially resolved.  She is due for follow-up with him in a couple of months.  She sees Dr. Steffanie Rainwater for heel spurs.  She wears Film/video editor with custom heel inserts from Dr. Irving Shows.  She also takes nabumetone 1 a day.  When she is on her feet a lot she increases it to twice a day.  Patient has chronic stasis edema she says that it does accumulate more through the day and go down overnight.  She also takes furosemide on a daily basis.  She is taking Micardis for her blood pressure and to supplement potassium.  Patient in for follow-up of elevated cholesterol. Doing well without complaints on current medication. Denies side effects of statin including myalgia and arthralgia and nausea. Also in today for liver function testing. Currently no chest pain, shortness of breath or other cardiovascular related symptoms noted.  Patient states that she had Covid about a year ago and questions if we think she should have the Covid vaccine.  She also is due for her flu shot today.  Depression screen [facility] 2/9 06/22/2020 06/18/2019 05/22/2018  Decreased Interest 0 0 0  Down, Depressed, Hopeless 0 0 0  PHQ - 2 Score 0 0 0    History Paisely has a past medical history of Hyperlipidemia and Obesity.   She has no past surgical history on file.   Her family history includes Hypertension in her mother; Kidney disease in her mother.She reports that she has never smoked. She has never used smokeless  tobacco. She reports that she does not drink alcohol and does not use drugs.    ROS Review of Systems  Constitutional: Negative.   HENT: Negative.  Negative for congestion.   Eyes: Negative for visual disturbance.  Respiratory: Positive for wheezing (Better since starting GERD meds). Negative for shortness of breath.   Cardiovascular: Negative for chest pain.  Gastrointestinal: Negative for abdominal pain, constipation, diarrhea, nausea and vomiting.  Genitourinary: Negative for difficulty urinating.  Musculoskeletal: Positive for arthralgias (heels, knees). Negative for myalgias.  Neurological: Negative for headaches.  Psychiatric/Behavioral: Negative for sleep disturbance.    Objective:  BP 120/83   Pulse 73   Temp 98 F (36.7 C) (Temporal)   Ht $R'5\' 4"'YS$  (1.626 m)   Wt (!) 316 lb (143.3 kg)   BMI 54.24 kg/m   BP Readings from Last 3 Encounters:  06/22/20 120/83  02/25/20 134/80  01/28/20 126/74    Wt Readings from Last 3 Encounters:  06/22/20 (!) 316 lb (143.3 kg)  02/25/20 (!) 317 lb (143.8 kg)  01/28/20 (!) 310 lb (140.6 kg)     Physical Exam Constitutional:      General: She is not in acute distress.    Appearance: She is well-developed. She is obese. She is not ill-appearing.  HENT:     Head: Normocephalic and atraumatic.     Right Ear: External ear normal.  Left Ear: External ear normal.     Nose: Nose normal.  Eyes:     Conjunctiva/sclera: Conjunctivae normal.     Pupils: Pupils are equal, round, and reactive to light.  Neck:     Thyroid: No thyromegaly.  Cardiovascular:     Rate and Rhythm: Normal rate and regular rhythm.     Heart sounds: Normal heart sounds. No murmur heard.   Pulmonary:     Effort: Pulmonary effort is normal. No respiratory distress.     Breath sounds: Normal breath sounds. No wheezing or rales.  Abdominal:     General: Bowel sounds are normal. There is no distension.     Palpations: Abdomen is soft.     Tenderness: There  is no abdominal tenderness.  Musculoskeletal:        General: Swelling (BLE) present.     Cervical back: Normal range of motion and neck supple.  Lymphadenopathy:     Cervical: No cervical adenopathy.  Skin:    General: Skin is warm and dry.  Neurological:     Mental Status: She is alert and oriented to person, place, and time.     Deep Tendon Reflexes: Reflexes are normal and symmetric.  Psychiatric:        Behavior: Behavior normal.        Thought Content: Thought content normal.        Judgment: Judgment normal.       Assessment & Plan:   [name] was seen today for annual exam.  Diagnoses and all orders for this visit:  Well adult exam -     CBC with Differential/Platelet -     CMP14+EGFR -     Urinalysis  Need for immunization against influenza -     Flu Vaccine QUAD 36+ mos IM -     CBC with Differential/Platelet -     CMP14+EGFR  Mixed hyperlipidemia -     atorvastatin (LIPITOR) 40 MG tablet; TAKE ONE (1) TABLET EACH DAY -     CBC with Differential/Platelet -     CMP14+EGFR -     Lipid panel  Morbid obesity (HCC) -     CBC with Differential/Platelet -     CMP14+EGFR  Essential hypertension -     furosemide (LASIX) 40 MG tablet; Take 1 tablet (40 mg total) by mouth daily. -     telmisartan (MICARDIS) 80 MG tablet; Take 1 tablet (80 mg total) by mouth daily. -     CBC with Differential/Platelet -     CMP14+EGFR -     Urinalysis  Arthritis of knee -     nabumetone (RELAFEN) 750 MG tablet; Take 1 tablet (750 mg total) by mouth 2 (two) times daily. -     CBC with Differential/Platelet -     CMP14+EGFR  Calcaneal spur, unspecified laterality -     nabumetone (RELAFEN) 750 MG tablet; Take 1 tablet (750 mg total) by mouth 2 (two) times daily. -     CBC with Differential/Platelet -     CMP14+EGFR  Upper airway cough syndrome vs cough variant asthma -     albuterol (VENTOLIN HFA) 108 (90 Base) MCG/ACT inhaler; Inhale 2 puffs into the lungs every 6 (six)  hours as needed for wheezing or shortness of breath. -     mometasone-formoterol (DULERA) 100-5 MCG/ACT AERO; dulera 100 Take 2 puffs first thing in am and then another 2 puffs about 12 hours later. -     CBC with Differential/Platelet -  CMP14+EGFR  Gastroesophageal reflux disease without esophagitis -     famotidine (PEPCID) 20 MG tablet; One after supper -     pantoprazole (PROTONIX) 40 MG tablet; Take 1 tablet (40 mg total) by mouth daily. Take 30-60 min before first meal of the day -     CBC with Differential/Platelet -     CMP14+EGFR  Vitamin D deficiency -     CBC with Differential/Platelet -     CMP14+EGFR -     VITAMIN D 25 Hydroxy (Vit-D Deficiency, Fractures)  Encounter for screening for HIV -     CBC with Differential/Platelet -     CMP14+EGFR -     HIV Antibody (routine testing w rflx)  Need for hepatitis C screening test -     CBC with Differential/Platelet -     CMP14+EGFR -     Hepatitis C antibody       I have changed Joelyn Ghazarian's furosemide. I am also having her maintain her albuterol, atorvastatin, famotidine, Dulera, nabumetone, pantoprazole, and telmisartan.  Allergies as of 06/22/2020      Reactions   Amoxicillin    Penicillins       Medication List       Accurate as of June 22, 2020 12:03 PM. If you have any questions, ask your nurse or doctor.        albuterol 108 (90 Base) MCG/ACT inhaler Commonly known as: VENTOLIN HFA Inhale 2 puffs into the lungs every 6 (six) hours as needed for wheezing or shortness of breath.   atorvastatin 40 MG tablet Commonly known as: LIPITOR TAKE ONE (1) TABLET EACH DAY   Dulera 100-5 MCG/ACT Aero Generic drug: mometasone-formoterol dulera 100 Take 2 puffs first thing in am and then another 2 puffs about 12 hours later.   famotidine 20 MG tablet Commonly known as: Pepcid One after supper   furosemide 40 MG tablet Commonly known as: LASIX Take 1 tablet (40 mg total) by mouth daily. What  changed: additional instructions Changed by: Claretta Fraise, MD   nabumetone 750 MG tablet Commonly known as: RELAFEN Take 1 tablet (750 mg total) by mouth 2 (two) times daily.   pantoprazole 40 MG tablet Commonly known as: Protonix Take 1 tablet (40 mg total) by mouth daily. Take 30-60 min before first meal of the day   telmisartan 80 MG tablet Commonly known as: Micardis Take 1 tablet (80 mg total) by mouth daily.        Follow-up: Return in about 6 months (around 12/21/2020).  Claretta Fraise, M.D.

## 2020-06-23 ENCOUNTER — Other Ambulatory Visit: Payer: Self-pay | Admitting: Family Medicine

## 2020-06-23 LAB — CBC WITH DIFFERENTIAL/PLATELET
Basophils Absolute: 0 10*3/uL (ref 0.0–0.2)
Basos: 0 %
EOS (ABSOLUTE): 0.2 10*3/uL (ref 0.0–0.4)
Eos: 3 %
Hematocrit: 40.6 % (ref 34.0–46.6)
Hemoglobin: 13.3 g/dL (ref 11.1–15.9)
Immature Grans (Abs): 0 10*3/uL (ref 0.0–0.1)
Immature Granulocytes: 0 %
Lymphocytes Absolute: 2.1 10*3/uL (ref 0.7–3.1)
Lymphs: 26 %
MCH: 32.5 pg (ref 26.6–33.0)
MCHC: 32.8 g/dL (ref 31.5–35.7)
MCV: 99 fL — ABNORMAL HIGH (ref 79–97)
Monocytes Absolute: 0.7 10*3/uL (ref 0.1–0.9)
Monocytes: 8 %
Neutrophils Absolute: 5.1 10*3/uL (ref 1.4–7.0)
Neutrophils: 63 %
Platelets: 182 10*3/uL (ref 150–450)
RBC: 4.09 x10E6/uL (ref 3.77–5.28)
RDW: 12.3 % (ref 11.7–15.4)
WBC: 8.2 10*3/uL (ref 3.4–10.8)

## 2020-06-23 LAB — CMP14+EGFR
ALT: 11 IU/L (ref 0–32)
AST: 13 IU/L (ref 0–40)
Albumin/Globulin Ratio: 1.3 (ref 1.2–2.2)
Albumin: 3.8 g/dL (ref 3.8–4.8)
Alkaline Phosphatase: 135 IU/L — ABNORMAL HIGH (ref 44–121)
BUN/Creatinine Ratio: 20 (ref 9–23)
BUN: 12 mg/dL (ref 6–24)
Bilirubin Total: 1 mg/dL (ref 0.0–1.2)
CO2: 24 mmol/L (ref 20–29)
Calcium: 9 mg/dL (ref 8.7–10.2)
Chloride: 101 mmol/L (ref 96–106)
Creatinine, Ser: 0.59 mg/dL (ref 0.57–1.00)
GFR calc Af Amer: 125 mL/min/{1.73_m2} (ref >59)
GFR calc non Af Amer: 109 mL/min/{1.73_m2} (ref >59)
Globulin, Total: 2.9 g/dL (ref 1.5–4.5)
Glucose: 86 mg/dL (ref 65–99)
Potassium: 4.3 mmol/L (ref 3.5–5.2)
Sodium: 135 mmol/L (ref 134–144)
Total Protein: 6.7 g/dL (ref 6.0–8.5)

## 2020-06-23 LAB — HEPATITIS C ANTIBODY: Hep C Virus Ab: 0.1 s/co ratio (ref 0.0–0.9)

## 2020-06-23 LAB — LIPID PANEL
Chol/HDL Ratio: 2.9 ratio (ref 0.0–4.4)
Cholesterol, Total: 115 mg/dL (ref 100–199)
HDL: 39 mg/dL — ABNORMAL LOW (ref >39)
LDL Chol Calc (NIH): 62 mg/dL (ref 0–99)
Triglycerides: 62 mg/dL (ref 0–149)
VLDL Cholesterol Cal: 14 mg/dL (ref 5–40)

## 2020-06-23 LAB — VITAMIN D 25 HYDROXY (VIT D DEFICIENCY, FRACTURES): Vit D, 25-Hydroxy: 16 ng/mL — ABNORMAL LOW (ref 30.0–100.0)

## 2020-06-23 LAB — HIV ANTIBODY (ROUTINE TESTING W REFLEX): HIV Screen 4th Generation wRfx: NONREACTIVE

## 2020-06-23 MED ORDER — VITAMIN D (ERGOCALCIFEROL) 1.25 MG (50000 UNIT) PO CAPS: 50000.0000 [IU] | ORAL_CAPSULE | ORAL | 3 refills | Status: AC

## 2020-09-23 ENCOUNTER — Other Ambulatory Visit: Payer: Self-pay | Admitting: Family Medicine

## 2020-09-23 DIAGNOSIS — I10 Essential (primary) hypertension: Secondary | ICD-10-CM

## 2020-09-30 DIAGNOSIS — Z20822 Contact with and (suspected) exposure to covid-19: Secondary | ICD-10-CM | POA: Diagnosis not present

## 2020-10-01 DIAGNOSIS — Z20822 Contact with and (suspected) exposure to covid-19: Secondary | ICD-10-CM | POA: Diagnosis not present

## 2020-10-27 DIAGNOSIS — Z1231 Encounter for screening mammogram for malignant neoplasm of breast: Secondary | ICD-10-CM | POA: Diagnosis not present

## 2020-12-18 ENCOUNTER — Telehealth: Payer: Self-pay

## 2020-12-18 NOTE — Telephone Encounter (Signed)
Pt aware since it is her 6 mos ckup on her BP & cholesterol, she will be having labs done

## 2020-12-22 ENCOUNTER — Other Ambulatory Visit: Payer: Self-pay

## 2020-12-22 ENCOUNTER — Ambulatory Visit: Payer: BC Managed Care – PPO | Admitting: Nurse Practitioner

## 2020-12-22 ENCOUNTER — Other Ambulatory Visit: Payer: Self-pay | Admitting: Nurse Practitioner

## 2020-12-22 ENCOUNTER — Encounter: Payer: Self-pay | Admitting: Nurse Practitioner

## 2020-12-22 VITALS — BP 98/68 | HR 78 | Temp 98.9°F | Resp 20 | Ht 64.0 in | Wt 323.0 lb

## 2020-12-22 DIAGNOSIS — E782 Mixed hyperlipidemia: Secondary | ICD-10-CM

## 2020-12-22 DIAGNOSIS — K219 Gastro-esophageal reflux disease without esophagitis: Secondary | ICD-10-CM

## 2020-12-22 DIAGNOSIS — I10 Essential (primary) hypertension: Secondary | ICD-10-CM | POA: Diagnosis not present

## 2020-12-22 DIAGNOSIS — M7730 Calcaneal spur, unspecified foot: Secondary | ICD-10-CM | POA: Diagnosis not present

## 2020-12-22 DIAGNOSIS — E559 Vitamin D deficiency, unspecified: Secondary | ICD-10-CM

## 2020-12-22 MED ORDER — FUROSEMIDE 40 MG PO TABS: ORAL_TABLET | ORAL | 1 refills | Status: DC

## 2020-12-22 MED ORDER — FLUCONAZOLE 150 MG PO TABS: 150.0000 mg | ORAL_TABLET | Freq: Once | ORAL | 0 refills | Status: AC

## 2020-12-22 MED ORDER — PANTOPRAZOLE SODIUM 40 MG PO TBEC: 40.0000 mg | DELAYED_RELEASE_TABLET | Freq: Every day | ORAL | 3 refills | Status: DC

## 2020-12-22 MED ORDER — TELMISARTAN 80 MG PO TABS: 80.0000 mg | ORAL_TABLET | Freq: Every day | ORAL | 1 refills | Status: DC

## 2020-12-22 MED ORDER — ATORVASTATIN CALCIUM 40 MG PO TABS: ORAL_TABLET | ORAL | 3 refills | Status: DC

## 2020-12-22 NOTE — Progress Notes (Signed)
Subjective:    Patient ID: Madison Butler, female    DOB: 13-Jul-1971, 50 y.o.   MRN: [id number]   Chief Complaint: Medical Management of Chronic Issues    HPI:  1. Mixed hyperlipidemia Managing with atorvastatin.  Lab Results  Component Value Date   CHOL 115 06/22/2020   HDL 39 (L) 06/22/2020   LDLCALC 62 06/22/2020   TRIG 62 06/22/2020   CHOLHDL 2.9 06/22/2020   The ASCVD Risk score Mikey Bussing DC Jr., et al., 2013) failed to calculate for the following reasons:   The valid total cholesterol range is 130 to 320 mg/dL  2. Primary hypertension Managing with temisartan and lasix. Does not monitor at home. Denies HA, SOB, vision changes, CP. She has been having some episodes of dizziness when waking in the morning. She takes her temisartan in the morning.She has been taking 22m telmisartan since having COVID in June 2021 instructed by her pulmonologist.   BP Readings from Last 1 Encounters:  12/22/20 98/68    3. Gastroesophageal reflux disease without esophagitis Managing with pantoprazole. Is working well for her. She avoids her triggers. She was [place] this medication [place] She states she has had no issues with reflux.   4. Calcaneal spur, unspecified laterality Sees podiatry for management of this. She wears orthopedic shoe insoles. Takes Mebumetone for the inflammation.    5. Morbid obesity ([place] Weight up 8lbs since last seen.   Wt Readings from Last 3 Encounters:  12/22/20 (!) 323 lb (146.5 kg)  06/22/20 (!) 316 lb (143.3 kg)  02/25/20 (!) 317 lb (143.8 kg)    6. Vitamin D Deficiency  On Vit D supplement.   7. Upper Airway Cough PRN Ventolin per pulmonary. She states she does not use this often.    Outpatient Encounter Medications as of 12/22/2020  Medication Sig  . albuterol (VENTOLIN HFA) 108 (90 Base) MCG/ACT inhaler Inhale 2 puffs into the lungs every 6 (six) hours as needed for wheezing or shortness of breath.  .Marland Kitchen atorvastatin (LIPITOR) 40 MG tablet TAKE ONE (1) TABLET EACH DAY  . famotidine (PEPCID) 20 MG tablet One after supper  . furosemide (LASIX) 40 MG tablet TAKE ONE (1) TABLET EACH DAY  . mometasone-formoterol (DULERA) 100-5 MCG/ACT AERO dulera 100 Take 2 puffs first thing in am and then another 2 puffs about 12 hours later.  . nabumetone (RELAFEN) 750 MG tablet Take 1 tablet (750 mg total) by mouth 2 (two) times daily.  . pantoprazole (PROTONIX) 40 MG tablet Take 1 tablet (40 mg total) by mouth daily. Take 30-60 min before first meal of the day  . telmisartan (MICARDIS) 80 MG tablet Take 1 tablet (80 mg total) by mouth daily.  . Vitamin D, Ergocalciferol, (DRISDOL) 1.25 MG (50000 UNIT) CAPS capsule Take 1 capsule (50,000 Units total) by mouth every 7 (seven) days.   No facility-administered encounter medications on file as of 12/22/2020.    History reviewed. No pertinent surgical history.  Family History  Problem Relation Age of Onset  . Hypertension Mother   . Kidney disease Mother     New complaints: Vaginal discharge and pruritis. Discharge is clear, watery with no odor. No new partners. Has not had a PAP in 2 years.   Social history: Lives with husband and daughter.   Controlled substance contract: N/A    Review of Systems  Constitutional: Negative for chills, fatigue and fever.  Respiratory: Negative for cough, shortness of breath and  wheezing.   Cardiovascular: Negative for chest pain and palpitations.  Genitourinary: Positive for dysuria and vaginal discharge. Negative for difficulty urinating.  Neurological: Positive for dizziness and light-headedness. Negative for syncope and headaches.  Psychiatric/Behavioral: Negative for confusion. The patient is not nervous/anxious.        Objective:   Physical Exam Constitutional:      Appearance: She is obese.  Neck:     Vascular: No carotid bruit.  Cardiovascular:     Rate and Rhythm: Normal rate and regular rhythm.      Pulses: Normal pulses.     Heart sounds: Normal heart sounds.  Pulmonary:     Effort: Pulmonary effort is normal.     Breath sounds: Normal breath sounds.  Abdominal:     General: Bowel sounds are normal.     Palpations: Abdomen is soft.  Musculoskeletal:     Cervical back: Normal range of motion and neck supple.     Right lower leg: 3+ Edema present.     Left lower leg: 3+ Edema present.  Skin:    General: Skin is warm and dry.     Capillary Refill: Capillary refill takes less than 2 seconds.  Neurological:     Mental Status: She is alert and oriented to person, place, and time.  Psychiatric:        Mood and Affect: Mood normal.        Behavior: Behavior normal.    Vitals:   12/22/20 1559  BP: 98/68  Pulse: 78  Resp: 20  Temp: 98.9 F (37.2 C)  SpO2: 99%      Assessment & Plan:   Desarie Bowditch comes in today with chief complaint of Medical Management of Chronic Issues   Diagnosis and orders addressed:  1. Mixed hyperlipidemia Continue medication as prescribed. Low fat diet.  - Lipid panel - atorvastatin (LIPITOR) 40 MG tablet; TAKE ONE (1) TABLET EACH DAY  Dispense: 90 tablet; Refill: 3  2. Primary hypertension Decrease telmisartan to 40mg, half tablet, once daily. Monitor BP at home. Avoid high sodium diet.   - CBC with Differential/Platelet - CMP14+EGFR  furosemide (LASIX) 40 MG tablet; TAKE ONE (1) TABLET EACH DAY  Dispense: 90 tablet; Refill: 1 - telmisartan (MICARDIS) 80 MG tablet; Take 1 tablet (80 mg total) by mouth daily.  Dispense: 90 tablet; Refill: 1   3. Gastroesophageal reflux disease without esophagitis Continue medication as prescribed. Avoiding triggers and eating late at night.   - pantoprazole (PROTONIX) 40 MG tablet; Take 1 tablet (40 mg total) by mouth daily. Take 30-60 min before first meal of the day  Dispense: 90 tablet; Refill: 3  4. Calcaneal spur, unspecified laterality Continue to see podiatry  5. Morbid obesity (HCC) Discussed  weight management and regular exercise.   6. Vitamin D deficiency Continue supplement and try to get some daily outdoor activity.     Labs pending Health Maintenance reviewed Diet and exercise encouraged  Follow up plan: 6 months  Lorren Swanson, RN, BSN, FNP-Student  Mary-Margaret Martin, FNP   

## 2020-12-22 NOTE — Patient Instructions (Signed)

## 2020-12-23 ENCOUNTER — Other Ambulatory Visit: Payer: Self-pay | Admitting: Nurse Practitioner

## 2020-12-23 ENCOUNTER — Other Ambulatory Visit: Payer: Self-pay | Admitting: Family Medicine

## 2020-12-23 DIAGNOSIS — M1710 Unilateral primary osteoarthritis, unspecified knee: Secondary | ICD-10-CM

## 2020-12-23 DIAGNOSIS — M7730 Calcaneal spur, unspecified foot: Secondary | ICD-10-CM

## 2020-12-23 LAB — CMP14+EGFR
ALT: 11 IU/L (ref 0–32)
AST: 12 IU/L (ref 0–40)
Albumin/Globulin Ratio: 1.3 (ref 1.2–2.2)
Albumin: 3.8 g/dL (ref 3.8–4.8)
Alkaline Phosphatase: 140 IU/L — ABNORMAL HIGH (ref 44–121)
BUN/Creatinine Ratio: 19 (ref 9–23)
BUN: 14 mg/dL (ref 6–24)
Bilirubin Total: 0.7 mg/dL (ref 0.0–1.2)
CO2: 24 mmol/L (ref 20–29)
Calcium: 8.9 mg/dL (ref 8.7–10.2)
Chloride: 101 mmol/L (ref 96–106)
Creatinine, Ser: 0.75 mg/dL (ref 0.57–1.00)
Globulin, Total: 3 g/dL (ref 1.5–4.5)
Glucose: 89 mg/dL (ref 65–99)
Potassium: 4.6 mmol/L (ref 3.5–5.2)
Sodium: 138 mmol/L (ref 134–144)
Total Protein: 6.8 g/dL (ref 6.0–8.5)
eGFR: 98 mL/min/{1.73_m2} (ref >59)

## 2020-12-23 LAB — CBC WITH DIFFERENTIAL/PLATELET
Basophils Absolute: 0 10*3/uL (ref 0.0–0.2)
Basos: 0 %
EOS (ABSOLUTE): 0.4 10*3/uL (ref 0.0–0.4)
Eos: 4 %
Hematocrit: 42.2 % (ref 34.0–46.6)
Hemoglobin: 13.4 g/dL (ref 11.1–15.9)
Immature Grans (Abs): 0 10*3/uL (ref 0.0–0.1)
Immature Granulocytes: 0 %
Lymphocytes Absolute: 2.5 10*3/uL (ref 0.7–3.1)
Lymphs: 26 %
MCH: 32 pg (ref 26.6–33.0)
MCHC: 31.8 g/dL (ref 31.5–35.7)
MCV: 101 fL — ABNORMAL HIGH (ref 79–97)
Monocytes Absolute: 0.7 10*3/uL (ref 0.1–0.9)
Monocytes: 7 %
Neutrophils Absolute: 5.8 10*3/uL (ref 1.4–7.0)
Neutrophils: 63 %
Platelets: 173 10*3/uL (ref 150–450)
RBC: 4.19 x10E6/uL (ref 3.77–5.28)
RDW: 12.3 % (ref 11.7–15.4)
WBC: 9.5 10*3/uL (ref 3.4–10.8)

## 2020-12-23 LAB — LIPID PANEL
Chol/HDL Ratio: 2.7 ratio (ref 0.0–4.4)
Cholesterol, Total: 93 mg/dL — ABNORMAL LOW (ref 100–199)
HDL: 35 mg/dL — ABNORMAL LOW (ref >39)
LDL Chol Calc (NIH): 46 mg/dL (ref 0–99)
Triglycerides: 48 mg/dL (ref 0–149)
VLDL Cholesterol Cal: 12 mg/dL (ref 5–40)

## 2021-03-11 ENCOUNTER — Other Ambulatory Visit: Payer: Self-pay | Admitting: Interventional Radiology

## 2021-04-17 ENCOUNTER — Ambulatory Visit (HOSPITAL_COMMUNITY)
Admission: EM | Admit: 2021-04-17 | Discharge: 2021-04-17 | Disposition: A | Discharge status: Home / Self Care | Payer: BC Managed Care – PPO | Attending: Medical Oncology | Admitting: Medical Oncology

## 2021-04-17 ENCOUNTER — Ambulatory Visit (INDEPENDENT_AMBULATORY_CARE_PROVIDER_SITE_OTHER): Payer: BC Managed Care – PPO

## 2021-04-17 ENCOUNTER — Encounter (HOSPITAL_COMMUNITY): Payer: Self-pay

## 2021-04-17 ENCOUNTER — Other Ambulatory Visit: Payer: Self-pay

## 2021-04-17 DIAGNOSIS — M25561 Pain in right knee: Secondary | ICD-10-CM | POA: Diagnosis not present

## 2021-04-17 DIAGNOSIS — T148XXA Other injury of unspecified body region, initial encounter: Secondary | ICD-10-CM

## 2021-04-17 DIAGNOSIS — W19XXXA Unspecified fall, initial encounter: Secondary | ICD-10-CM

## 2021-04-17 NOTE — ED Triage Notes (Signed)
Pt presents with right arm abrasions and right knee pain & swelling after a fall in a parking lot this evening.

## 2021-04-17 NOTE — ED Provider Notes (Signed)
[facility]    CSN: [id number] Arrival date & time: 04/17/21  1621      History   Chief Complaint Chief Complaint  Patient presents with   Fall    HPI Madison Butler is a 50 y.o. female.   HPI  Fall: Patient reports that a few hours ago she was in a parking lot when she accidentally lost her footing and fell over.  She fell onto her right knee on the lateral side.  She did not hit her head or have LOC. She states that this was uncomfortable about a 5-6 out of 10 in nature but she is most concerned about the amount of bruising that she has on her anterior leg.  She is not currently on any blood thinners, she does have edema which is chronic in nature but is more impressive on the right leg after this injury.  No loss of sensation, numbness or tingling.  There are no open skin wounds of the legs but she does have an abrasion of the right upper arm.  Her last Tdap was in 2015.  Past Medical History:  Diagnosis Date   Hyperlipidemia    Obesity     Patient Active Problem List   Diagnosis Date Noted   DOE (dyspnea on exertion) 01/28/2020   Upper airway cough syndrome vs cough variant asthma 12/31/2019   Essential hypertension 06/07/2016   Hyperlipidemia 01/15/2013   Morbid obesity (HCC) 01/15/2013    History reviewed. No pertinent surgical history.  OB History   No obstetric history on file.      Home Medications    Prior to Admission medications   Medication Sig Start Date End Date Taking? Authorizing Provider  albuterol (VENTOLIN HFA) 108 (90 Base) MCG/ACT inhaler Inhale 2 puffs into the lungs every 6 (six) hours as needed for wheezing or shortness of breath. 06/22/20   Mechele Claude, MD  atorvastatin (LIPITOR) 40 MG tablet TAKE ONE (1) TABLET EACH DAY 12/22/20   Bennie Pierini, FNP  famotidine (PEPCID) 20 MG tablet One after supper 06/22/20   Mechele Claude, MD  furosemide (LASIX) 40 MG tablet TAKE ONE (1) TABLET EACH DAY 12/22/20   Daphine Deutscher,  Mary-Margaret, FNP  mometasone-formoterol (DULERA) 100-5 MCG/ACT AERO dulera 100 Take 2 puffs first thing in am and then another 2 puffs about 12 hours later. 06/22/20   Mechele Claude, MD  nabumetone (RELAFEN) 750 MG tablet TAKE ONE TABLET BY MOUTH TWICE DAILY 12/23/20   Daphine Deutscher, Mary-Margaret, FNP  pantoprazole (PROTONIX) 40 MG tablet Take 1 tablet (40 mg total) by mouth daily. Take 30-60 min before first meal of the day 12/22/20   Daphine Deutscher, Mary-Margaret, FNP  telmisartan (MICARDIS) 80 MG tablet Take 1 tablet (80 mg total) by mouth daily. 12/22/20   Daphine Deutscher Mary-Margaret, FNP  Vitamin D, Ergocalciferol, (DRISDOL) 1.25 MG (50000 UNIT) CAPS capsule Take 1 capsule (50,000 Units total) by mouth every 7 (seven) days. 06/23/20 06/22/21  Mechele Claude, MD    Family History Family History  Problem Relation Age of Onset   Hypertension Mother    Kidney disease Mother     Social History Social History   Tobacco Use   Smoking status: Never   Smokeless tobacco: Never  Substance Use Topics   Alcohol use: No   Drug use: No     Allergies   Amoxicillin and Penicillins   Review of Systems Review of Systems  As stated above in HPI Physical Exam Triage Vital Signs ED Triage Vitals  Enc  Vitals Group     BP 04/17/21 1740 (!) 155/68     Pulse Rate 04/17/21 1740 71     Resp 04/17/21 1740 20     Temp 04/17/21 1740 98.9 F (37.2 C)     Temp Source 04/17/21 1740 Oral     SpO2 04/17/21 1740 100 %     Weight --      Height --      Head Circumference --      Peak Flow --      Pain Score 04/17/21 1742 7     Pain Loc --      Pain Edu? --      Excl. in GC? --    No data found.  Updated Vital Signs BP (!) 155/68 (BP Location: Left Arm)   Pulse 71   Temp 98.9 F (37.2 C) (Oral)   Resp 20   SpO2 100%   Physical Exam Vitals and nursing note reviewed.  Constitutional:      General: She is not in acute distress.    Appearance: Normal appearance. She is obese. She is not ill-appearing,  toxic-appearing or diaphoretic.  Cardiovascular:     Pulses: Normal pulses.  Skin:    General: Skin is warm.     Findings: Bruising present.     Comments: Large hematoma of the right superior leg below the knee.  Mildly tender to palpation.  No swelling of the inferior leg.  No abrasions of the leg.  Range of motion, strength and sensation intact.  There is a roughly 3 inch x 2 inch linear abrasion cluster of the right lower arm near elbow.   Neurological:     Mental Status: She is alert.     UC Treatments / Results  Labs (all labs ordered are listed, but only abnormal results are displayed) Labs Reviewed - No data to display  EKG   Radiology No results found.  Procedures Procedures (including critical care time)  Medications Ordered in UC Medications - No data to display  Initial Impression / Assessment and Plan / UC Course  I have reviewed the triage vital signs and the nursing notes.  Pertinent labs & imaging results that were available during my care of the patient were reviewed by me and considered in my medical decision making (see chart for details).     New.  Discussed hematoma with patient.  She will use a cold compress for the next 2 days along with an Ace wrap and elevation of the leg.  She can use Tylenol or ibuprofen as needed for discomfort.  She is aware that the hematoma and skin discoloration likely will remain for a few weeks and slowly resolve.  After the initial first few days I would recommend heating pad.  Her x-ray is negative for fracture.  We are going to clean her wound on her arm and dressed this for her. Final Clinical Impressions(s) / UC Diagnoses   Final diagnoses:  None   Discharge Instructions   None    ED Prescriptions   None    PDMP not reviewed this encounter.   Rushie Chestnut, [place] 04/17/21 1813

## 2021-04-21 ENCOUNTER — Encounter: Payer: Self-pay | Admitting: Nurse Practitioner

## 2021-04-21 ENCOUNTER — Other Ambulatory Visit: Payer: Self-pay

## 2021-04-21 ENCOUNTER — Ambulatory Visit: Payer: BC Managed Care – PPO | Admitting: Nurse Practitioner

## 2021-04-21 VITALS — BP 121/85 | HR 82 | Temp 97.8°F | Ht 64.0 in | Wt 328.0 lb

## 2021-04-21 DIAGNOSIS — M25561 Pain in right knee: Secondary | ICD-10-CM | POA: Diagnosis not present

## 2021-04-21 MED ORDER — TRAMADOL HCL 50 MG PO TABS: 50.0000 mg | ORAL_TABLET | Freq: Three times a day (TID) | ORAL | 0 refills | Status: AC | PRN

## 2021-04-21 MED ORDER — METHYLPREDNISOLONE ACETATE 40 MG/ML IJ SUSP
80.0000 mg | Freq: Once | INTRAMUSCULAR | Status: AC
  Administered 2021-04-21: 80 mg via INTRAMUSCULAR

## 2021-04-21 NOTE — Patient Instructions (Signed)
Acute Knee Pain, Adult °Many things can cause knee pain. Sometimes, knee pain is sudden (acute) and may be caused by damage, swelling, or irritation of the muscles and tissues that support your knee. °The pain often goes away on its own with time and rest. If the pain does not go away, tests may be done to find out what is causing the pain. °Follow these instructions at home: °If you have a knee sleeve or brace: ° °Wear the knee sleeve or brace as told by your doctor. Take it off only as told by your doctor. °Loosen it if your toes: °Tingle. °Become numb. °Turn cold and blue. °Keep it clean. °If the knee sleeve or brace is not waterproof: °Do not let it get wet. °Cover it with a watertight covering when you take a bath or shower. °Activity °Rest your knee. °Do not do things that cause pain or make pain worse. °Avoid activities where both feet leave the ground at the same time (high-impact activities). Examples are running, jumping rope, and doing jumping jacks. °Work with a physical therapist to make a safe exercise program, as told by your doctor. °Managing pain, stiffness, and swelling ° °If told, put ice on the knee. To do this: °If you have a removable knee sleeve or brace, take it off as told by your doctor. °Put ice in a plastic bag. °Place a towel between your skin and the bag. °Leave the ice on for 20 minutes, 2-3 times a day. °Take off the ice if your skin turns bright red. This is very important. If you cannot feel pain, heat, or cold, you have a greater risk of damage to the area. °If told, use an elastic bandage to put pressure (compression) on your injured knee. °Raise your knee above the level of your heart while you are sitting or lying down. °Sleep with a pillow under your knee. °General instructions °Take over-the-counter and prescription medicines only as told by your doctor. °Do not smoke or use any products that contain nicotine or tobacco. If you need help quitting, ask your doctor. °If you are  overweight, work with your doctor and a food expert (dietitian) to set goals to lose weight. Being overweight can make your knee hurt more. °Watch for any changes in your symptoms. °Keep all follow-up visits. °Contact a doctor if: °The knee pain does not stop. °The knee pain changes or gets worse. °You have a fever along with knee pain. °Your knee is red or feels warm when you touch it. °Your knee gives out or locks up. °Get help right away if: °Your knee swells, and the swelling gets worse. °You cannot move your knee. °You have very bad knee pain that does not get better with pain medicine. °Summary °Many things can cause knee pain. The pain often goes away on its own with time and rest. °Your doctor may do tests to find out the cause of the pain. °Watch for any changes in your symptoms. Relieve your pain with rest, medicines, light activity, and use of ice. °Get help right away if you cannot move your knee or your knee pain is very bad. °This information is not intended to replace advice given to you by your health care provider. Make sure you discuss any questions you have with your health care provider. °Document Revised: 02/12/2020 Document Reviewed: 02/12/2020 °Elsevier Patient Education © 2022 [employer] ° °

## 2021-04-21 NOTE — Progress Notes (Signed)
Acute Office Visit  Subjective:    Patient ID: Madison Butler, female    DOB: 1970/12/02, 50 y.o.   MRN: [id number]  Chief Complaint  Patient presents with   Fall   Leg Pain    Leg Pain  The incident occurred 3 to 5 days ago. The incident occurred at home. The injury mechanism was a fall. The pain is present in the right leg. The quality of the pain is described as aching. The pain is at a severity of 8/10. The pain is severe. The pain has been Constant since onset. Pertinent negatives include no loss of motion, loss of sensation, numbness or tingling. She reports no foreign bodies present. The symptoms are aggravated by movement. She has tried ice and NSAIDs for the symptoms. The treatment provided mild relief.    Past Medical History:  Diagnosis Date   Hyperlipidemia    Obesity     No past surgical history on file.  Family History  Problem Relation Age of Onset   Hypertension Mother    Kidney disease Mother     Social History   Socioeconomic History   Marital status: Married    Spouse name: Not on file   Number of children: Not on file   Years of education: Not on file   Highest education level: Not on file  Occupational History   Not on file  Tobacco Use   Smoking status: Never   Smokeless tobacco: Never  Substance and Sexual Activity   Alcohol use: No   Drug use: No   Sexual activity: Not on file  Other Topics Concern   Not on file  Social History Narrative   Not on file   Social Determinants of Health   Financial Resource Strain: Not on file  Food Insecurity: Not on file  Transportation Needs: Not on file  Physical Activity: Not on file  Stress: Not on file  Social Connections: Not on file  Intimate Partner Violence: Not on file    Outpatient Medications Prior to Visit  Medication Sig Dispense Refill   albuterol (VENTOLIN HFA) 108 (90 Base) MCG/ACT inhaler Inhale 2 puffs into the lungs every 6 (six) hours as needed for wheezing or shortness of  breath. 18 g 11   atorvastatin (LIPITOR) 40 MG tablet TAKE ONE (1) TABLET EACH DAY 90 tablet 3   famotidine (PEPCID) 20 MG tablet One after supper 90 tablet 3   furosemide (LASIX) 40 MG tablet TAKE ONE (1) TABLET EACH DAY 90 tablet 1   mometasone-formoterol (DULERA) 100-5 MCG/ACT AERO dulera 100 Take 2 puffs first thing in am and then another 2 puffs about 12 hours later. 13 g 2   nabumetone (RELAFEN) 750 MG tablet TAKE ONE TABLET BY MOUTH TWICE DAILY 180 tablet 0   pantoprazole (PROTONIX) 40 MG tablet Take 1 tablet (40 mg total) by mouth daily. Take 30-60 min before first meal of the day 90 tablet 3   telmisartan (MICARDIS) 80 MG tablet Take 1 tablet (80 mg total) by mouth daily. 90 tablet 1   Vitamin D, Ergocalciferol, (DRISDOL) 1.25 MG (50000 UNIT) CAPS capsule Take 1 capsule (50,000 Units total) by mouth every 7 (seven) days. 13 capsule 3   No facility-administered medications prior to visit.    Allergies  Allergen Reactions   Amoxicillin    Penicillins     Review of Systems  Constitutional: Negative.   HENT: Negative.    Respiratory: Negative.    Cardiovascular: Negative.   Gastrointestinal:  Negative.   Musculoskeletal:  Positive for joint swelling.  Skin:  Positive for color change.  Neurological:  Negative for tingling and numbness.  All other systems reviewed and are negative.     Objective:    Physical Exam Vitals and nursing note reviewed.  Constitutional:      Appearance: Normal appearance.  HENT:     Head: Normocephalic.     Right Ear: External ear normal.     Nose: Nose normal.     Mouth/Throat:     Mouth: Mucous membranes are moist.     Pharynx: Oropharynx is clear.  Eyes:     Conjunctiva/sclera: Conjunctivae normal.  Cardiovascular:     Pulses: Normal pulses.     Heart sounds: Normal heart sounds.  Pulmonary:     Effort: Pulmonary effort is normal.     Breath sounds: Normal breath sounds.  Abdominal:     General: Bowel sounds are normal.   Musculoskeletal:        General: Swelling present.     Right lower leg: Swelling and tenderness present.       Legs:     Comments: Bruising, swelling and pain  Neurological:     Mental Status: She is alert and oriented to person, place, and time.  Psychiatric:        Behavior: Behavior normal.    BP 121/85   Pulse 82   Temp 97.8 F (36.6 C) (Temporal)   Ht $R'5\' 4"'wT$  (1.626 m)   Wt (!) 328 lb (148.8 kg)   SpO2 96%   BMI 56.30 kg/m  Wt Readings from Last 3 Encounters:  04/21/21 (!) 328 lb (148.8 kg)  12/22/20 (!) 323 lb (146.5 kg)  06/22/20 (!) 316 lb (143.3 kg)    Health Maintenance Due  Topic Date Due   COVID-19 Vaccine (1) Never done   COLONOSCOPY (Pts 45-83yrs Insurance coverage will need to be confirmed)  Never done   INFLUENZA VACCINE  04/12/2021    There are no preventive care reminders to display for this patient.   Lab Results  Component Value Date   TSH 2.810 06/18/2019   Lab Results  Component Value Date   WBC 9.5 12/22/2020   HGB 13.4 12/22/2020   HCT 42.2 12/22/2020   MCV 101 (H) 12/22/2020   PLT 173 12/22/2020   Lab Results  Component Value Date   NA 138 12/22/2020   K 4.6 12/22/2020   CO2 24 12/22/2020   GLUCOSE 89 12/22/2020   BUN 14 12/22/2020   CREATININE 0.75 12/22/2020   BILITOT 0.7 12/22/2020   ALKPHOS 140 (H) 12/22/2020   AST 12 12/22/2020   ALT 11 12/22/2020   PROT 6.8 12/22/2020   ALBUMIN 3.8 12/22/2020   CALCIUM 8.9 12/22/2020   EGFR 98 12/22/2020   Lab Results  Component Value Date   CHOL 93 (L) 12/22/2020   Lab Results  Component Value Date   HDL 35 (L) 12/22/2020   Lab Results  Component Value Date   LDLCALC 46 12/22/2020   Lab Results  Component Value Date   TRIG 48 12/22/2020   Lab Results  Component Value Date   CHOLHDL 2.7 12/22/2020   No results found for: HGBA1C     Assessment & Plan:   Problem List Items Addressed This Visit       Other   Acute pain of right knee - Primary    Uncontrolled  right leg pain from a fall.  X-ray negative for fracture.  Assist tenderness and swelling.  Continue icing leg, continue anti-inflammatory, 80 Depo-Medrol shot given in clinic, tramadol for 5 days.,  Education provided to patient printed handouts given. Follow-up with unresolved or worsening symptoms.       Relevant Medications   traMADol (ULTRAM) 50 MG tablet     Meds ordered this encounter  Medications   traMADol (ULTRAM) 50 MG tablet    Sig: Take 1 tablet (50 mg total) by mouth every 8 (eight) hours as needed for up to 5 days.    Dispense:  15 tablet    Refill:  0    Order Specific Question:   Supervising Provider    Answer:   Janora Norlander [[id number]]   methylPREDNISolone acetate (DEPO-MEDROL) injection 80 mg     Ivy Lynn, NP

## 2021-04-21 NOTE — Assessment & Plan Note (Signed)
Uncontrolled right leg pain from a fall.  X-ray negative for fracture.  Assist tenderness and swelling.  Continue icing leg, continue anti-inflammatory, 80 Depo-Medrol shot given in clinic, tramadol for 5 days.,  Education provided to patient printed handouts given. Follow-up with unresolved or worsening symptoms.

## 2021-04-25 ENCOUNTER — Other Ambulatory Visit: Payer: Self-pay

## 2021-04-25 ENCOUNTER — Ambulatory Visit (HOSPITAL_COMMUNITY)
Admission: EM | Admit: 2021-04-25 | Discharge: 2021-04-25 | Disposition: A | Discharge status: Home / Self Care | Payer: BC Managed Care – PPO

## 2021-04-25 ENCOUNTER — Encounter (HOSPITAL_COMMUNITY): Payer: Self-pay | Admitting: Emergency Medicine

## 2021-04-25 DIAGNOSIS — S8011XA Contusion of right lower leg, initial encounter: Secondary | ICD-10-CM

## 2021-04-25 DIAGNOSIS — S40811A Abrasion of right upper arm, initial encounter: Secondary | ICD-10-CM | POA: Diagnosis not present

## 2021-04-25 HISTORY — DX: Essential (primary) hypertension: I10

## 2021-04-25 NOTE — ED Provider Notes (Signed)
[facility]    CSN: [id number] Arrival date & time: 04/25/21  1058      History   Chief Complaint Chief Complaint  Patient presents with   Fall    HPI Madison Butler is a 50 y.o. female.   Patient presenting today with 6-day history of right lower leg pain, right forearm pain after falling in the parking lot at the grocery store.  She states it was mechanical fall where her foot folded inward causing her to trip.  She states she has an abrasion to the right forearm and significant bruising to the anterior knee and below the knee in the right lower leg.  So far has been taking NSAIDs, Tylenol and tramadol here and there with minimal temporary relief.  She does not feel that the swelling or bruising is improving since onset.  Has now been evaluated at urgent care on day of incident with negative right knee x-ray and has been to her primary care provider several days ago without significant abnormal findings.  Normal motion of the knee, weightbearing on both legs.   Past Medical History:  Diagnosis Date   Hyperlipidemia    Hypertension    Obesity     Patient Active Problem List   Diagnosis Date Noted   Acute pain of right knee 04/21/2021   DOE (dyspnea on exertion) 01/28/2020   Upper airway cough syndrome vs cough variant asthma 12/31/2019   Essential hypertension 06/07/2016   Hyperlipidemia 01/15/2013   Morbid obesity ([facility]) 01/15/2013    History reviewed. No pertinent surgical history.  OB History   No obstetric history on file.      Home Medications    Prior to Admission medications   Medication Sig Start Date End Date Taking? Authorizing Provider  acetaminophen (TYLENOL) 325 MG tablet Take 650 mg by mouth every 6 (six) hours as needed.   Yes [provider]  atorvastatin (LIPITOR) 40 MG tablet TAKE ONE (1) TABLET EACH DAY 12/22/20  Yes Daphine Deutscher, Mary-Margaret, FNP  furosemide (LASIX) 40 MG tablet TAKE ONE (1) TABLET EACH DAY 12/22/20  Yes Martin,  Mary-Margaret, FNP  mometasone-formoterol (DULERA) 100-5 MCG/ACT AERO dulera 100 Take 2 puffs first thing in am and then another 2 puffs about 12 hours later. 06/22/20  Yes Stacks, Broadus John, MD  nabumetone (RELAFEN) 750 MG tablet TAKE ONE TABLET BY MOUTH TWICE DAILY 12/23/20  Yes Daphine Deutscher, Mary-Margaret, FNP  telmisartan (MICARDIS) 80 MG tablet Take 1 tablet (80 mg total) by mouth daily. 12/22/20  Yes Daphine Deutscher, Mary-Margaret, FNP  traMADol (ULTRAM) 50 MG tablet Take 1 tablet (50 mg total) by mouth every 8 (eight) hours as needed for up to 5 days. 04/21/21 04/26/21 Yes Daryll Drown, NP  Vitamin D, Ergocalciferol, (DRISDOL) 1.25 MG (50000 UNIT) CAPS capsule Take 1 capsule (50,000 Units total) by mouth every 7 (seven) days. 06/23/20 06/22/21 Yes Stacks, Broadus John, MD  albuterol (VENTOLIN HFA) 108 (90 Base) MCG/ACT inhaler Inhale 2 puffs into the lungs every 6 (six) hours as needed for wheezing or shortness of breath. 06/22/20   Mechele Claude, MD  famotidine (PEPCID) 20 MG tablet One after supper 06/22/20   Mechele Claude, MD  pantoprazole (PROTONIX) 40 MG tablet Take 1 tablet (40 mg total) by mouth daily. Take 30-60 min before first meal of the day 12/22/20   Bennie Pierini, FNP    Family History Family History  Problem Relation Age of Onset   Hypertension Mother    Kidney disease Mother  Social History Social History   Tobacco Use   Smoking status: Never   Smokeless tobacco: Never  Vaping Use   Vaping Use: Never used  Substance Use Topics   Alcohol use: No   Drug use: No     Allergies   Amoxicillin and Penicillins   Review of Systems Review of Systems Per HPI  Physical Exam Triage Vital Signs ED Triage Vitals  Enc Vitals Group     BP 04/25/21 1221 138/82     Pulse Rate 04/25/21 1221 75     Resp 04/25/21 1221 20     Temp 04/25/21 1221 98.5 F (36.9 C)     Temp Source 04/25/21 1221 Oral     SpO2 04/25/21 1221 96 %     Weight --      Height --      Head Circumference  --      Peak Flow --      Pain Score 04/25/21 1215 5     Pain Loc --      Pain Edu? --      Excl. in GC? --    No data found.  Updated Vital Signs BP 138/82 (BP Location: Right Arm)   Pulse 75   Temp 98.5 F (36.9 C) (Oral)   Resp 20   SpO2 96%   Visual Acuity Right Eye Distance:   Left Eye Distance:   Bilateral Distance:    Right Eye Near:   Left Eye Near:    Bilateral Near:     Physical Exam Vitals and nursing note reviewed.  Constitutional:      Appearance: Normal appearance. She is not ill-appearing.  HENT:     Head: Atraumatic.     Mouth/Throat:     Mouth: Mucous membranes are moist.  Eyes:     Extraocular Movements: Extraocular movements intact.     Conjunctiva/sclera: Conjunctivae normal.  Cardiovascular:     Rate and Rhythm: Normal rate and regular rhythm.     Heart sounds: Normal heart sounds.  Pulmonary:     Effort: Pulmonary effort is normal.     Breath sounds: Normal breath sounds.  Musculoskeletal:        General: Swelling, tenderness and signs of injury present. No deformity. Normal range of motion.     Cervical back: Normal range of motion and neck supple.     Comments: Significant contusions to right lower leg just below the knee at site of impact and fall.  Tender to palpation diffusely in this area.  No significant joint line tenderness right knee bilaterally, normal range of motion in knee and ankle  Skin:    General: Skin is warm and dry.     Findings: Bruising present.     Comments: Well-healing superficial abrasion to right forearm from fall  Neurological:     Mental Status: She is alert and oriented to person, place, and time.     Motor: No weakness.     Gait: Gait normal.  Psychiatric:        Mood and Affect: Mood normal.        Thought Content: Thought content normal.        Judgment: Judgment normal.     UC Treatments / Results  Labs (all labs ordered are listed, but only abnormal results are displayed) Labs Reviewed - No data  to display  EKG   Radiology No results found.  Procedures Procedures (including critical care time)  Medications Ordered in UC Medications -  No data to display  Initial Impression / Assessment and Plan / UC Course  I have reviewed the triage vital signs and the nursing notes.  Pertinent labs & imaging results that were available during my care of the patient were reviewed by me and considered in my medical decision making (see chart for details).     Has already had a right knee x-ray several days ago since incident which was negative for bony abnormality, symptoms not worsening but just not improving.  Discussed importance of compression stockings, RICE protocol, over-the-counter pain relievers ongoing and reviewed expectations on time to healing for an injury such as this.  Abrasions on arm healing well, discussed home wound care in order to avoid infection and signs of infection where she should return for recheck.  Work note given for rest.  Return for worsening symptoms at any time.  Final Clinical Impressions(s) / UC Diagnoses   Final diagnoses:  Contusion of multiple sites of right lower extremity, initial encounter  Abrasion of right upper extremity, initial encounter     Discharge Instructions      Wear compression stocking that goes up to the knee daily until bedtime until the swelling and bruising subsides.  Ice the area off and on throughout the day.  Elevate the leg as often as possible.  Keep the arm abrasion clean, apply Neosporin daily and keep the area covered until it heals over.     ED Prescriptions   None    PDMP not reviewed this encounter.   Particia Nearing, [place] 04/25/21 1243

## 2021-04-25 NOTE — ED Triage Notes (Signed)
Patient fell last Saturday 03/17/2021. [facility].   Patient then saw pcp.  Continues to have bruising, swelling and pain in right leg.

## 2021-04-25 NOTE — Discharge Instructions (Addendum)
Wear compression stocking that goes up to the knee daily until bedtime until the swelling and bruising subsides.  Ice the area off and on throughout the day.  Elevate the leg as often as possible.  Keep the arm abrasion clean, apply Neosporin daily and keep the area covered until it heals over.

## 2021-05-26 ENCOUNTER — Other Ambulatory Visit: Payer: Self-pay | Admitting: Nurse Practitioner

## 2021-05-26 DIAGNOSIS — I10 Essential (primary) hypertension: Secondary | ICD-10-CM

## 2021-05-26 DIAGNOSIS — M1710 Unilateral primary osteoarthritis, unspecified knee: Secondary | ICD-10-CM

## 2021-05-26 DIAGNOSIS — M7730 Calcaneal spur, unspecified foot: Secondary | ICD-10-CM

## 2021-05-31 ENCOUNTER — Encounter: Payer: Self-pay | Admitting: Nurse Practitioner

## 2021-05-31 ENCOUNTER — Other Ambulatory Visit: Payer: Self-pay

## 2021-05-31 ENCOUNTER — Ambulatory Visit: Payer: BC Managed Care – PPO | Admitting: Nurse Practitioner

## 2021-05-31 VITALS — BP 133/80 | HR 74 | Temp 98.0°F | Resp 20 | Ht 64.0 in | Wt 326.0 lb

## 2021-05-31 DIAGNOSIS — M5432 Sciatica, left side: Secondary | ICD-10-CM | POA: Diagnosis not present

## 2021-05-31 DIAGNOSIS — Z23 Encounter for immunization: Secondary | ICD-10-CM | POA: Diagnosis not present

## 2021-05-31 DIAGNOSIS — S8992XA Unspecified injury of left lower leg, initial encounter: Secondary | ICD-10-CM | POA: Diagnosis not present

## 2021-05-31 MED ORDER — METHYLPREDNISOLONE ACETATE 80 MG/ML IJ SUSP
80.0000 mg | Freq: Once | INTRAMUSCULAR | Status: AC
  Administered 2021-05-31: 80 mg via INTRAMUSCULAR

## 2021-05-31 MED ORDER — PREDNISONE 20 MG PO TABS: ORAL_TABLET | ORAL | 0 refills | Status: DC

## 2021-05-31 NOTE — Patient Instructions (Signed)

## 2021-05-31 NOTE — Progress Notes (Signed)
   Subjective:    Patient ID: Madison Butler, female    DOB: 01-06-1971, 50 y.o.   MRN: [id number]  Chief Complaint: leg pain  HPI Patient come sin with 2 complaints: -Patient feel 6 weeks ago and injured her right knee. She still has  a pocket of fluid on it. She says it hurts to sleep on that side. - having left leg pain that radiates down to her knee.    Review of Systems  Constitutional:  Negative for diaphoresis.  Eyes:  Negative for pain.  Respiratory:  Negative for shortness of breath.   Cardiovascular:  Negative for chest pain, palpitations and leg swelling.  Gastrointestinal:  Negative for abdominal pain.  Endocrine: Negative for polydipsia.  Musculoskeletal:  Positive for joint swelling.  Skin:  Negative for rash.  Neurological:  Negative for dizziness, weakness and headaches.  Hematological:  Does not bruise/bleed easily.  All other systems reviewed and are negative.     Objective:   Physical Exam Vitals and nursing note reviewed.  Constitutional:      Appearance: Normal appearance.  Cardiovascular:     Rate and Rhythm: Normal rate and regular rhythm.     Heart sounds: Normal heart sounds.  Pulmonary:     Effort: Pulmonary effort is normal.     Breath sounds: Normal breath sounds.  Musculoskeletal:     Comments: Large hard swelling of right upper tibial area. Slight pain on palpation. Swelling not located in knee joint. FROM of lumbar sppine (-) SLR bil Motor strength and sensation lower ext  intact  Skin:    General: Skin is warm.  Neurological:     General: No focal deficit present.     Mental Status: She is alert and oriented to person, place, and time.    BP 133/80   Pulse 74   Temp 98 F (36.7 C) (Temporal)   Resp 20   Ht 5\' 4"  (1.626 m)   Wt (!) 326 lb (147.9 kg)   SpO2 99%   BMI 55.96 kg/m        Assessment & Plan:   Madison Butler in today with chief complaint of No chief complaint on file.   1. Need for immunization against  influenza - Flu Vaccine QUAD 21mo+IM (Fluarix, Fluzone & Alfiuria Quad PF)  2. Left sided sciatica Moist heat  rest - methylPREDNISolone acetate (DEPO-MEDROL) injection 80 mg - predniSONE (DELTASONE) 20 MG tablet; 2 po at sametime daily for 5 days-  Dispense: 10 tablet; Refill: 0  3. Soft tissue injury of lower leg, left, initial encounter Ice if helps May not go away    The above assessment and management plan was discussed with the patient. The patient verbalized understanding of and has agreed to the management plan. Patient is aware to call the clinic if symptoms persist or worsen. Patient is aware when to return to the clinic for a follow-up visit. Patient educated on when it is appropriate to go to the emergency department.   Madison June, FNP

## 2021-06-29 ENCOUNTER — Ambulatory Visit: Payer: Self-pay | Admitting: Nurse Practitioner

## 2021-07-01 ENCOUNTER — Other Ambulatory Visit: Payer: Self-pay

## 2021-07-01 ENCOUNTER — Ambulatory Visit (INDEPENDENT_AMBULATORY_CARE_PROVIDER_SITE_OTHER): Payer: BC Managed Care – PPO | Admitting: Nurse Practitioner

## 2021-07-01 ENCOUNTER — Encounter: Payer: Self-pay | Admitting: Nurse Practitioner

## 2021-07-01 VITALS — BP 142/78 | HR 71 | Temp 98.4°F | Ht 64.0 in | Wt 319.8 lb

## 2021-07-01 DIAGNOSIS — E782 Mixed hyperlipidemia: Secondary | ICD-10-CM

## 2021-07-01 DIAGNOSIS — I10 Essential (primary) hypertension: Secondary | ICD-10-CM

## 2021-07-01 DIAGNOSIS — Z0001 Encounter for general adult medical examination with abnormal findings: Secondary | ICD-10-CM | POA: Diagnosis not present

## 2021-07-01 DIAGNOSIS — K219 Gastro-esophageal reflux disease without esophagitis: Secondary | ICD-10-CM | POA: Diagnosis not present

## 2021-07-01 DIAGNOSIS — Z0000 Encounter for general adult medical examination without abnormal findings: Secondary | ICD-10-CM

## 2021-07-01 DIAGNOSIS — R635 Abnormal weight gain: Secondary | ICD-10-CM | POA: Diagnosis not present

## 2021-07-01 MED ORDER — TELMISARTAN 80 MG PO TABS: 80.0000 mg | ORAL_TABLET | Freq: Every day | ORAL | 1 refills | Status: DC

## 2021-07-01 NOTE — Progress Notes (Signed)
Subjective:    Patient ID: Madison Butler, female    DOB: June 27, 1971, 50 y.o.   MRN: [id number]   Chief Complaint: annual physical - no pap  HPI:  1. Essential hypertension No c/o chest or headache. Does not check blood pressure at home. She is only taking 1/2 of micardis daily. BP Readings from Last 3 Encounters:  07/01/21 (!) 160/100  05/31/21 133/80  04/25/21 138/82    2. Mixed hyperlipidemia Does try to watch diet but does no exercise. Lab Results  Component Value Date   CHOL 93 (L) 12/22/2020   HDL 35 (L) 12/22/2020   LDLCALC 46 12/22/2020   TRIG 48 12/22/2020   CHOLHDL 2.7 12/22/2020     3. Morbid obesity ([place]) Weight is down 7lbs Wt Readings from Last 3 Encounters:  07/01/21 (!) 319 lb 12.8 oz (145.1 kg)  05/31/21 (!) 326 lb (147.9 kg)  04/21/21 (!) 328 lb (148.8 kg)   BMI Readings from Last 3 Encounters:  07/01/21 54.89 kg/m  05/31/21 55.96 kg/m  04/21/21 56.30 kg/m       Outpatient Encounter Medications as of 07/01/2021  Medication Sig   acetaminophen (TYLENOL) 325 MG tablet Take 650 mg by mouth every 6 (six) hours as needed.   albuterol (VENTOLIN HFA) 108 (90 Base) MCG/ACT inhaler Inhale 2 puffs into the lungs every 6 (six) hours as needed for wheezing or shortness of breath.   atorvastatin (LIPITOR) 40 MG tablet TAKE ONE (1) TABLET EACH DAY   famotidine (PEPCID) 20 MG tablet One after supper   furosemide (LASIX) 40 MG tablet TAKE ONE (1) TABLET EACH DAY   mometasone-formoterol (DULERA) 100-5 MCG/ACT AERO dulera 100 Take 2 puffs first thing in am and then another 2 puffs about 12 hours later.   nabumetone (RELAFEN) 750 MG tablet TAKE ONE TABLET BY MOUTH TWICE DAILY   pantoprazole (PROTONIX) 40 MG tablet Take 1 tablet (40 mg total) by mouth daily. Take 30-60 min before first meal of the day   telmisartan (MICARDIS) 80 MG tablet TAKE ONE (1) TABLET BY MOUTH EVERY DAY (Patient taking differently: 40 mg.)   [DISCONTINUED] predniSONE (DELTASONE) 20 MG  tablet 2 po at sametime daily for 5 days-   No facility-administered encounter medications on file as of 07/01/2021.    History reviewed. No pertinent surgical history.  Family History  Problem Relation Age of Onset   Hypertension Mother    Kidney disease Mother     New complaints: None today  Social history: Lives with family  Controlled substance contract: n/a     Review of Systems  Constitutional:  Negative for diaphoresis.  Eyes:  Negative for pain.  Respiratory:  Negative for shortness of breath.   Cardiovascular:  Negative for chest pain, palpitations and leg swelling.  Gastrointestinal:  Negative for abdominal pain.  Endocrine: Negative for polydipsia.  Skin:  Negative for rash.  Neurological:  Negative for dizziness, weakness and headaches.  Hematological:  Does not bruise/bleed easily.  All other systems reviewed and are negative.     Objective:   Physical Exam Vitals and nursing note reviewed.  Constitutional:      General: She is not in acute distress.    Appearance: Normal appearance. She is well-developed.  HENT:     Head: Normocephalic.     Right Ear: Tympanic membrane normal.     Left Ear: Tympanic membrane normal.     Nose: Nose normal.     Mouth/Throat:     Mouth: Mucous membranes  are moist.  Eyes:     Pupils: Pupils are equal, round, and reactive to light.  Neck:     Vascular: No carotid bruit or JVD.  Cardiovascular:     Rate and Rhythm: Normal rate and regular rhythm.     Heart sounds: Normal heart sounds.  Pulmonary:     Effort: Pulmonary effort is normal. No respiratory distress.     Breath sounds: Normal breath sounds. No wheezing or rales.  Chest:     Chest wall: No tenderness.  Abdominal:     General: Bowel sounds are normal. There is no distension or abdominal bruit.     Palpations: Abdomen is soft. There is no hepatomegaly, splenomegaly, mass or pulsatile mass.     Tenderness: There is no abdominal tenderness.   Musculoskeletal:        General: Normal range of motion.     Cervical back: Normal range of motion and neck supple.  Lymphadenopathy:     Cervical: No cervical adenopathy.  Skin:    General: Skin is warm and dry.  Neurological:     Mental Status: She is alert and oriented to person, place, and time.     Deep Tendon Reflexes: Reflexes are normal and symmetric.  Psychiatric:        Behavior: Behavior normal.        Thought Content: Thought content normal.        Judgment: Judgment normal.    BP (!) 142/78   Pulse 71   Temp 98.4 F (36.9 C)   Ht 5' 4" (1.626 m)   Wt (!) 319 lb 12.8 oz (145.1 kg)   SpO2 98%   BMI 54.89 kg/m         Assessment & Plan:  Madison Butler comes in today with chief complaint of Medical Management of Chronic Issues   Diagnosis and orders addressed:  1. Annual physical exam - Thyroid Panel With TSH  2. Mixed hyperlipidemia Low fat diet - Lipid panel  3. Morbid obesity ([place]) Discussed diet and exercise for person with BMI >25 Will recheck weight in 3-6 months  4. Gastroesophageal reflux disease without esophagitis Avoid spicy foods Do not eat 2 hours prior to bedtime  5. Primary hypertension Low sodium diet Increase micardis to 1 tablet daily - telmisartan (MICARDIS) 80 MG tablet; Take 1 tablet (80 mg total) by mouth daily.  Dispense: 90 tablet; Refill: 1 - CBC with Differential/Platelet - CMP14+EGFR   Labs pending Health Maintenance reviewed Diet and exercise encouraged  Follow up plan: 6 months    [place], FNP

## 2021-07-01 NOTE — Patient Instructions (Signed)
Exercising to Stay Healthy °To become healthy and stay healthy, it is recommended that you do moderate-intensity and vigorous-intensity exercise. You can tell that you are exercising at a moderate intensity if your heart starts beating faster and you start breathing faster but can still hold a conversation. You can tell that you are exercising at a vigorous intensity if you are breathing much harder and faster and cannot hold a conversation while exercising. °How can exercise benefit me? °Exercising regularly is important. It has many health benefits, such as: °Improving overall fitness, flexibility, and endurance. °Increasing bone density. °Helping with weight control. °Decreasing body fat. °Increasing muscle strength and endurance. °Reducing stress and tension, anxiety, depression, or anger. °Improving overall health. °What guidelines should I follow while exercising? °Before you start a new exercise program, talk with your health care provider. °Do not exercise so much that you hurt yourself, feel dizzy, or get very short of breath. °Wear comfortable clothes and wear shoes with good support. °Drink plenty of water while you exercise to prevent dehydration or heat stroke. °Work out until your breathing and your heartbeat get faster (moderate intensity). °How often should I exercise? °Choose an activity that you enjoy, and set realistic goals. Your health care provider can help you make an activity plan that is individually designed and works best for you. °Exercise regularly as told by your health care provider. This may include: °Doing strength training two times a week, such as: °Lifting weights. °Using resistance bands. °Push-ups. °Sit-ups. °Yoga. °Doing a certain intensity of exercise for a given amount of time. Choose from these options: °A total of 150 minutes of moderate-intensity exercise every week. °A total of 75 minutes of vigorous-intensity exercise every week. °A mix of moderate-intensity and  vigorous-intensity exercise every week. °Children, pregnant women, people who have not exercised regularly, people who are overweight, and older adults may need to talk with a health care provider about what activities are safe to perform. If you have a medical condition, be sure to talk with your health care provider before you start a new exercise program. °What are some exercise ideas? °Moderate-intensity exercise ideas include: °Walking 1 mile (1.6 km) in about 15 minutes. °Biking. °Hiking. °Golfing. °Dancing. °Water aerobics. °Vigorous-intensity exercise ideas include: °Walking 4.5 miles (7.2 km) or more in about 1 hour. °Jogging or running 5 miles (8 km) in about 1 hour. °Biking 10 miles (16.1 km) or more in about 1 hour. °Lap swimming. °Roller-skating or in-line skating. °Cross-country skiing. °Vigorous competitive sports, such as football, basketball, and soccer. °Jumping rope. °Aerobic dancing. °What are some everyday activities that can help me get exercise? °Yard work, such as: °Pushing a lawn mower. °Raking and bagging leaves. °Washing your car. °Pushing a stroller. °Shoveling snow. °Gardening. °Washing windows or floors. °How can I be more active in my day-to-day activities? °Use stairs instead of an elevator. °Take a walk during your lunch break. °If you drive, park your car farther away from your work or school. °If you take public transportation, get off one stop early and walk the rest of the way. °Stand up or walk around during all of your indoor phone calls. °Get up, stretch, and walk around every 30 minutes throughout the day. °Enjoy exercise with a friend. Support to continue exercising will help you keep a regular routine of activity. °Where to find more information °You can find more information about exercising to stay healthy from: °[place] Department of Health and Human Services: www.hhs.gov °Centers for Disease Control and Prevention (  CDC): www.cdc.gov °Summary °Exercising regularly is  important. It will improve your overall fitness, flexibility, and endurance. °Regular exercise will also improve your overall health. It can help you control your weight, reduce stress, and improve your bone density. °Do not exercise so much that you hurt yourself, feel dizzy, or get very short of breath. °Before you start a new exercise program, talk with your health care provider. °This information is not intended to replace advice given to you by your health care provider. Make sure you discuss any questions you have with your health care provider. °Document Revised: 12/25/2020 Document Reviewed: 12/25/2020 °Elsevier Patient Education © 2022 [employer] Inc. ° °

## 2021-07-02 LAB — CMP14+EGFR
ALT: 15 IU/L (ref 0–32)
AST: 14 IU/L (ref 0–40)
Albumin/Globulin Ratio: 1.5 (ref 1.2–2.2)
Albumin: 4.1 g/dL (ref 3.8–4.8)
Alkaline Phosphatase: 157 IU/L — ABNORMAL HIGH (ref 44–121)
BUN/Creatinine Ratio: 20 (ref 9–23)
BUN: 14 mg/dL (ref 6–24)
Bilirubin Total: 1 mg/dL (ref 0.0–1.2)
CO2: 24 mmol/L (ref 20–29)
Calcium: 9.3 mg/dL (ref 8.7–10.2)
Chloride: 101 mmol/L (ref 96–106)
Creatinine, Ser: 0.7 mg/dL (ref 0.57–1.00)
Globulin, Total: 2.8 g/dL (ref 1.5–4.5)
Glucose: 81 mg/dL (ref 70–99)
Potassium: 4.6 mmol/L (ref 3.5–5.2)
Sodium: 139 mmol/L (ref 134–144)
Total Protein: 6.9 g/dL (ref 6.0–8.5)
eGFR: 106 mL/min/{1.73_m2} (ref >59)

## 2021-07-02 LAB — LIPID PANEL
Chol/HDL Ratio: 2.4 ratio (ref 0.0–4.4)
Cholesterol, Total: 100 mg/dL (ref 100–199)
HDL: 42 mg/dL (ref >39)
LDL Chol Calc (NIH): 46 mg/dL (ref 0–99)
Triglycerides: 48 mg/dL (ref 0–149)
VLDL Cholesterol Cal: 12 mg/dL (ref 5–40)

## 2021-07-02 LAB — CBC WITH DIFFERENTIAL/PLATELET
Basophils Absolute: 0 10*3/uL (ref 0.0–0.2)
Basos: 0 %
EOS (ABSOLUTE): 0.2 10*3/uL (ref 0.0–0.4)
Eos: 2 %
Hematocrit: 40.7 % (ref 34.0–46.6)
Hemoglobin: 13.4 g/dL (ref 11.1–15.9)
Immature Grans (Abs): 0 10*3/uL (ref 0.0–0.1)
Immature Granulocytes: 0 %
Lymphocytes Absolute: 2.3 10*3/uL (ref 0.7–3.1)
Lymphs: 25 %
MCH: 32.9 pg (ref 26.6–33.0)
MCHC: 32.9 g/dL (ref 31.5–35.7)
MCV: 100 fL — ABNORMAL HIGH (ref 79–97)
Monocytes Absolute: 0.7 10*3/uL (ref 0.1–0.9)
Monocytes: 7 %
Neutrophils Absolute: 6 10*3/uL (ref 1.4–7.0)
Neutrophils: 66 %
Platelets: 204 10*3/uL (ref 150–450)
RBC: 4.07 x10E6/uL (ref 3.77–5.28)
RDW: 11.8 % (ref 11.7–15.4)
WBC: 9.2 10*3/uL (ref 3.4–10.8)

## 2021-07-02 LAB — THYROID PANEL WITH TSH
Free Thyroxine Index: 2.7 (ref 1.2–4.9)
T3 Uptake Ratio: 32 % (ref 24–39)
T4, Total: 8.5 ug/dL (ref 4.5–12.0)
TSH: 3.27 u[IU]/mL (ref 0.450–4.500)

## 2021-09-09 ENCOUNTER — Other Ambulatory Visit: Payer: Self-pay | Admitting: Nurse Practitioner

## 2021-09-09 DIAGNOSIS — E782 Mixed hyperlipidemia: Secondary | ICD-10-CM

## 2021-09-09 DIAGNOSIS — I10 Essential (primary) hypertension: Secondary | ICD-10-CM

## 2021-09-23 ENCOUNTER — Other Ambulatory Visit: Payer: Self-pay

## 2021-09-23 DIAGNOSIS — Z1231 Encounter for screening mammogram for malignant neoplasm of breast: Secondary | ICD-10-CM

## 2021-12-01 ENCOUNTER — Other Ambulatory Visit: Payer: Self-pay | Admitting: Nurse Practitioner

## 2021-12-01 DIAGNOSIS — M7730 Calcaneal spur, unspecified foot: Secondary | ICD-10-CM

## 2021-12-01 DIAGNOSIS — M1710 Unilateral primary osteoarthritis, unspecified knee: Secondary | ICD-10-CM

## 2021-12-07 DIAGNOSIS — Z1231 Encounter for screening mammogram for malignant neoplasm of breast: Secondary | ICD-10-CM | POA: Diagnosis not present

## 2022-01-03 ENCOUNTER — Ambulatory Visit: Payer: BC Managed Care – PPO | Admitting: Nurse Practitioner

## 2022-01-06 ENCOUNTER — Ambulatory Visit: Payer: BC Managed Care – PPO | Admitting: Nurse Practitioner

## 2022-01-30 ENCOUNTER — Other Ambulatory Visit: Payer: Self-pay | Admitting: Nurse Practitioner

## 2022-01-30 DIAGNOSIS — R21 Rash and other nonspecific skin eruption: Secondary | ICD-10-CM | POA: Diagnosis not present

## 2022-01-30 DIAGNOSIS — L259 Unspecified contact dermatitis, unspecified cause: Secondary | ICD-10-CM | POA: Diagnosis not present

## 2022-01-30 DIAGNOSIS — I10 Essential (primary) hypertension: Secondary | ICD-10-CM

## 2022-02-25 DIAGNOSIS — B354 Tinea corporis: Secondary | ICD-10-CM | POA: Diagnosis not present

## 2022-03-03 IMAGING — DX DG KNEE COMPLETE 4+V*R*
4 series · 4 of 4 positions shown · non-contrast
Comparison: None.

CLINICAL DATA: Fall.  Knee pain.

EXAM:
RIGHT KNEE - COMPLETE 4+ VIEW

[knee ap (1 of 2)]
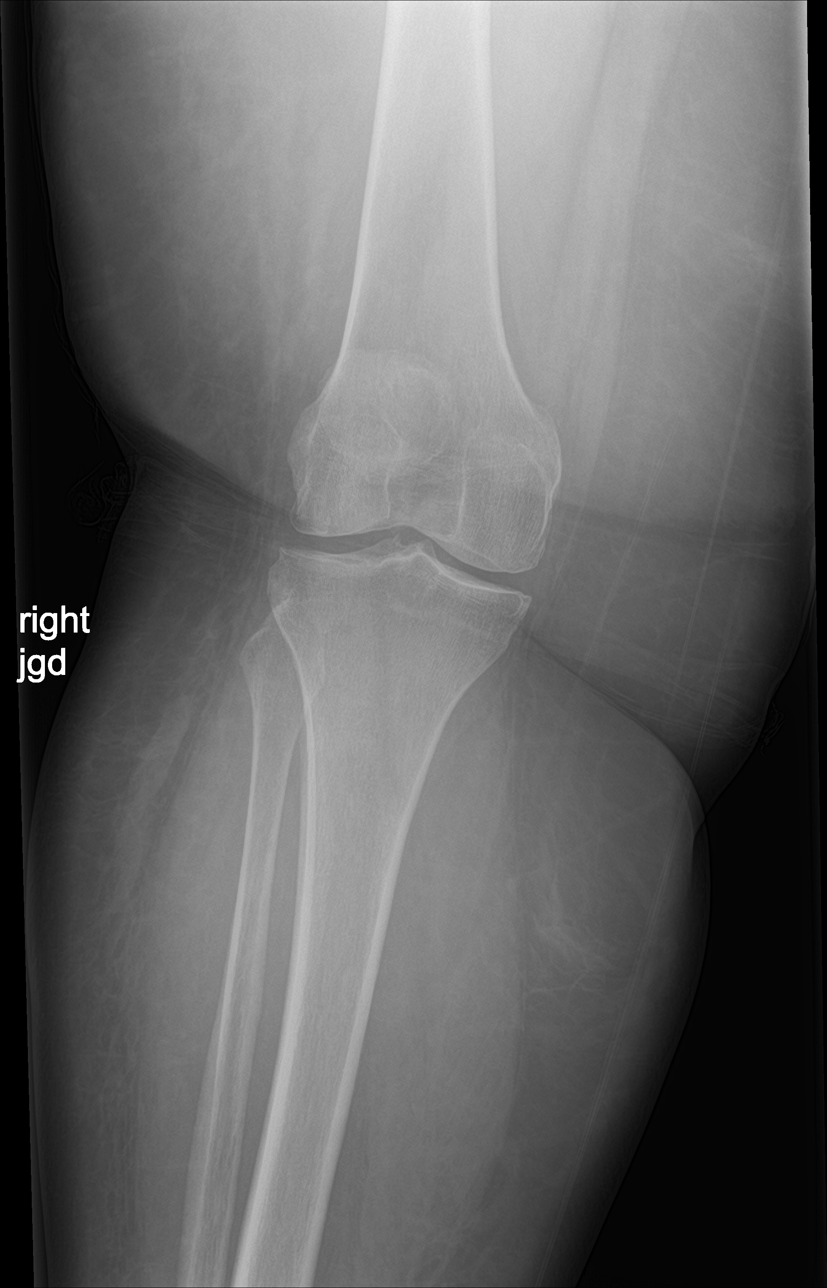

[knee obl]
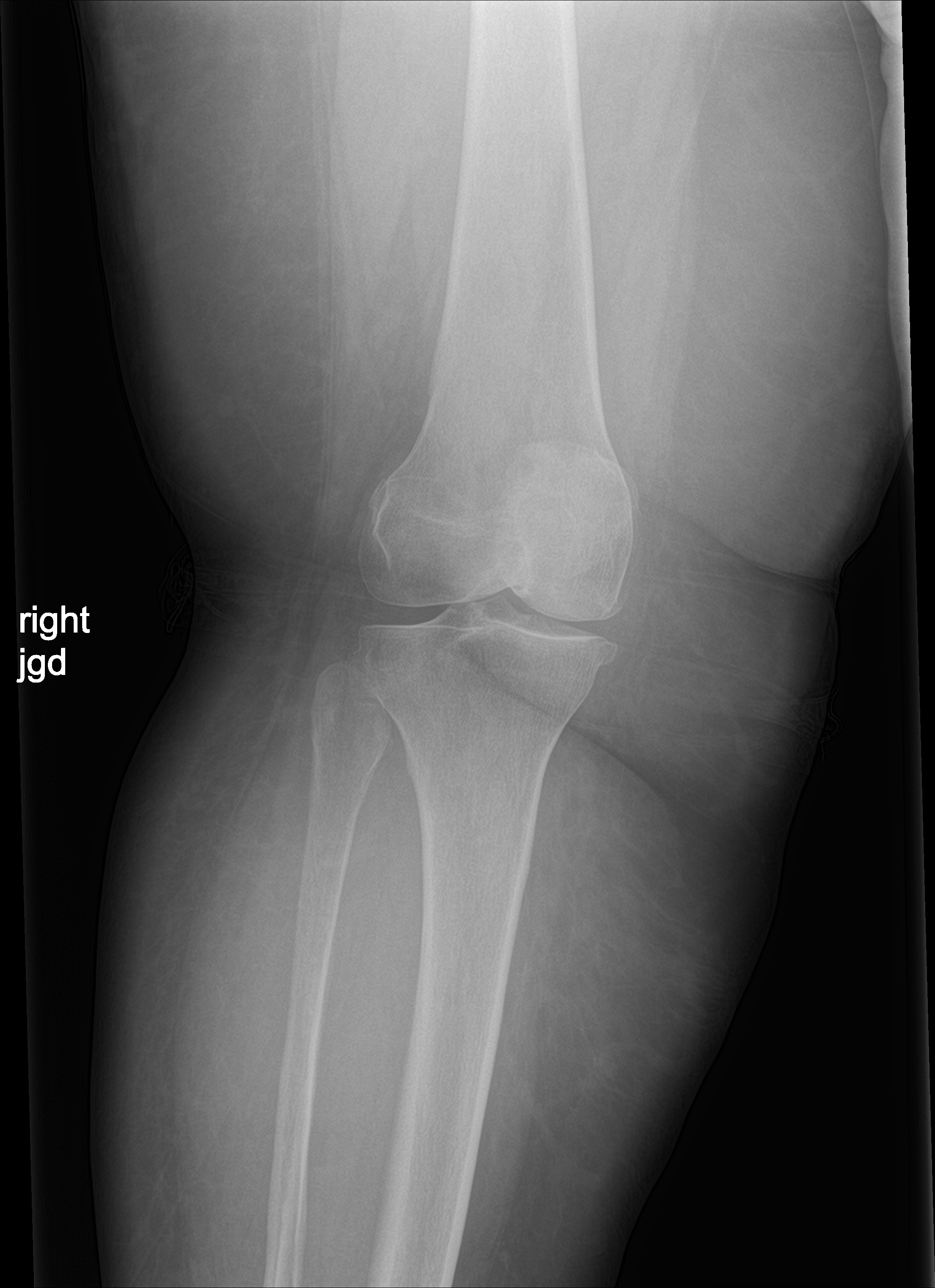

[knee lat]
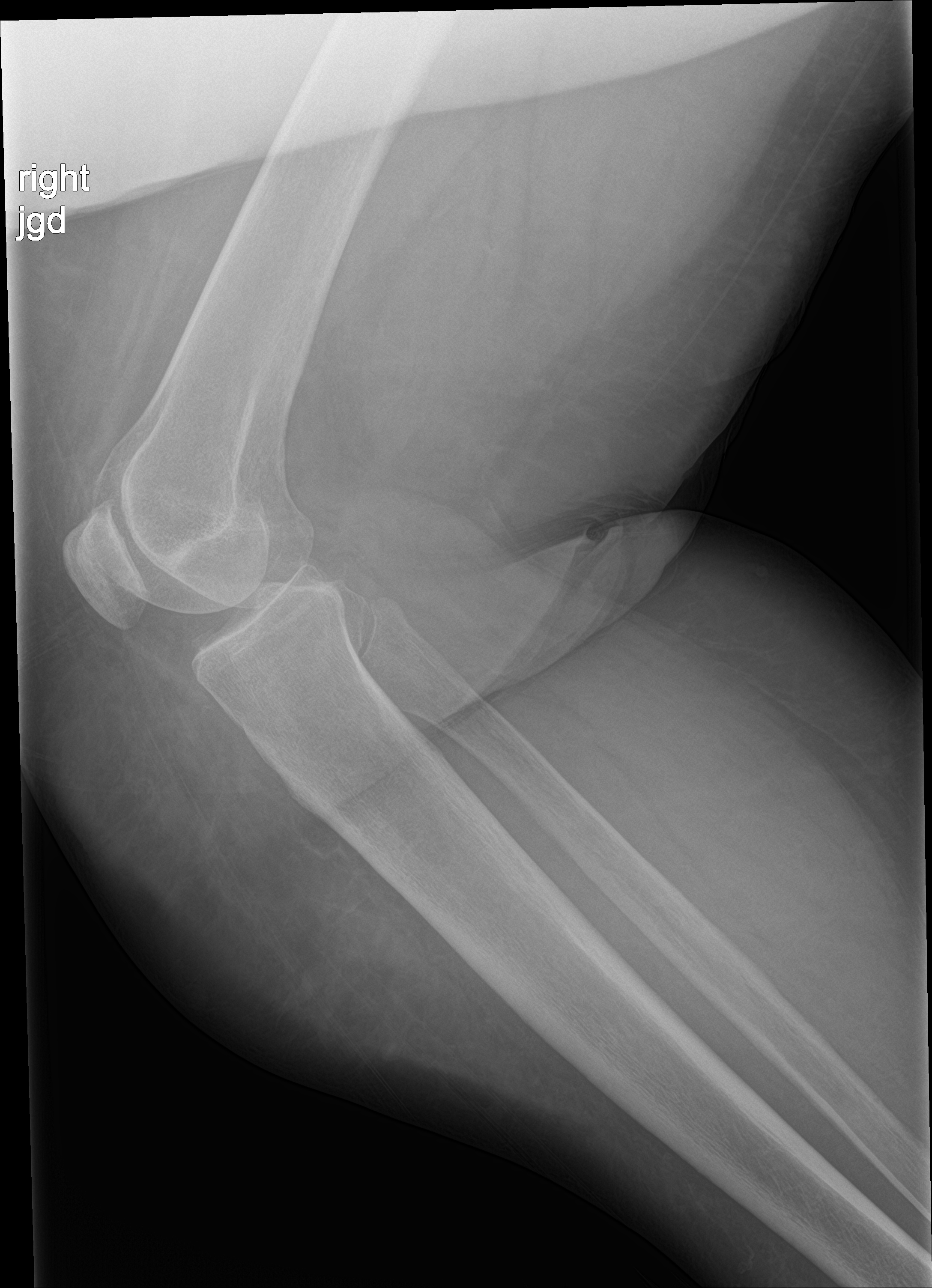

[knee ap (2 of 2)]
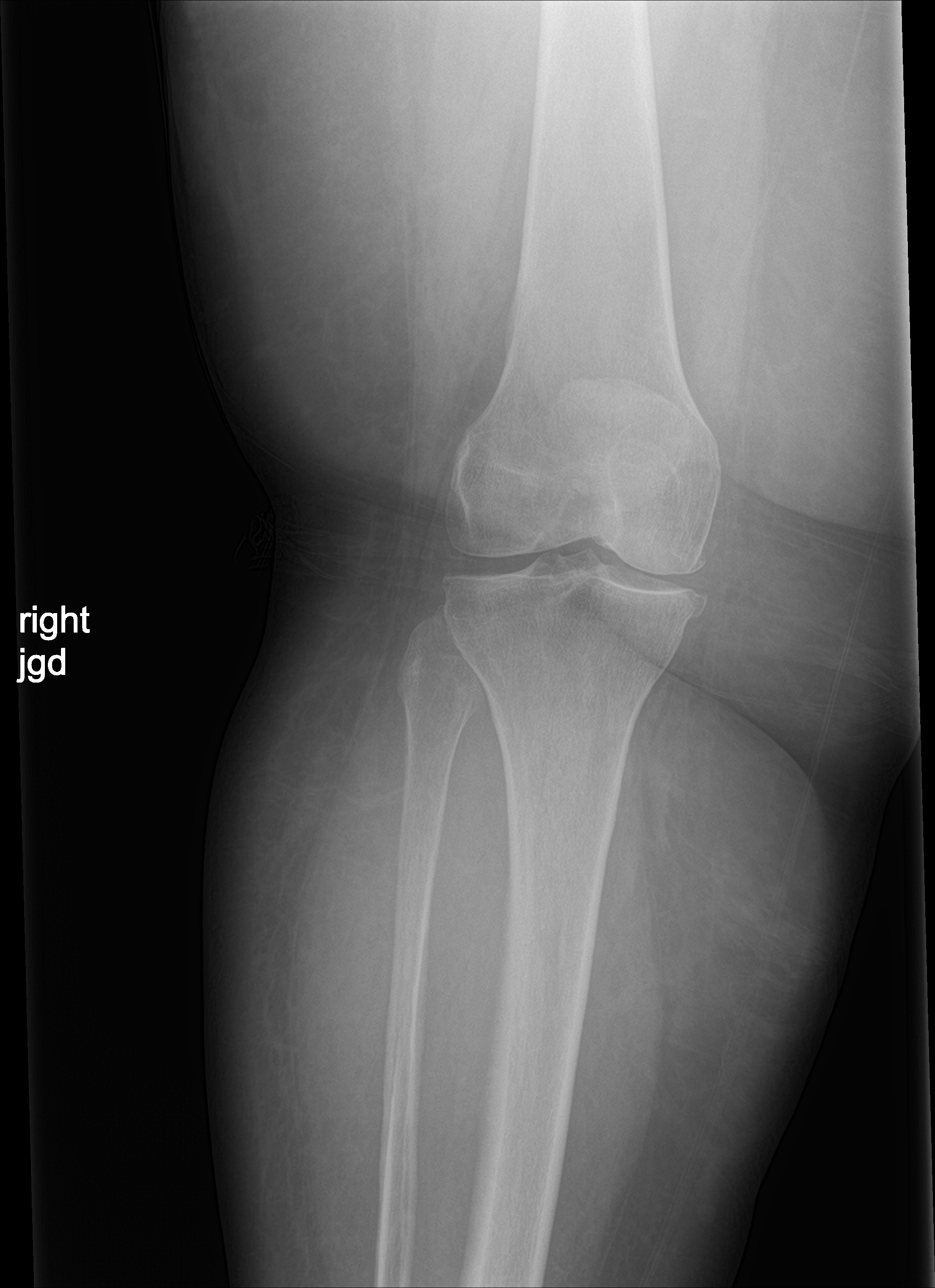

[4 of 4 positions shown; findings below may reference images not displayed]

FINDINGS: Mild degenerative changes are present, primarily involving the
MEDIAL compartment. No acute fracture or subluxation. No evidence
for joint effusion.
IMPRESSION: No evidence for acute  abnormality.

## 2022-03-04 ENCOUNTER — Other Ambulatory Visit: Payer: Self-pay | Admitting: Nurse Practitioner

## 2022-03-04 DIAGNOSIS — E782 Mixed hyperlipidemia: Secondary | ICD-10-CM

## 2022-03-04 DIAGNOSIS — I10 Essential (primary) hypertension: Secondary | ICD-10-CM

## 2022-03-11 DIAGNOSIS — L309 Dermatitis, unspecified: Secondary | ICD-10-CM | POA: Diagnosis not present

## 2022-03-11 DIAGNOSIS — L298 Other pruritus: Secondary | ICD-10-CM | POA: Diagnosis not present

## 2022-03-21 DIAGNOSIS — L2084 Intrinsic (allergic) eczema: Secondary | ICD-10-CM | POA: Diagnosis not present

## 2022-03-21 DIAGNOSIS — L810 Postinflammatory hyperpigmentation: Secondary | ICD-10-CM | POA: Diagnosis not present

## 2022-04-04 ENCOUNTER — Encounter: Payer: Self-pay | Admitting: Nurse Practitioner

## 2022-04-04 ENCOUNTER — Ambulatory Visit: Payer: BC Managed Care – PPO | Admitting: Nurse Practitioner

## 2022-04-04 VITALS — BP 104/72 | HR 51 | Temp 97.7°F | Resp 20 | Ht 64.0 in | Wt 336.0 lb

## 2022-04-04 DIAGNOSIS — E782 Mixed hyperlipidemia: Secondary | ICD-10-CM | POA: Diagnosis not present

## 2022-04-04 DIAGNOSIS — I10 Essential (primary) hypertension: Secondary | ICD-10-CM | POA: Diagnosis not present

## 2022-04-04 DIAGNOSIS — K219 Gastro-esophageal reflux disease without esophagitis: Secondary | ICD-10-CM

## 2022-04-04 MED ORDER — ATORVASTATIN CALCIUM 40 MG PO TABS: ORAL_TABLET | ORAL | 1 refills | Status: DC

## 2022-04-04 MED ORDER — TELMISARTAN 80 MG PO TABS: 80.0000 mg | ORAL_TABLET | Freq: Every day | ORAL | 1 refills | Status: DC

## 2022-04-04 MED ORDER — FUROSEMIDE 40 MG PO TABS: ORAL_TABLET | ORAL | 1 refills | Status: DC

## 2022-04-04 NOTE — Patient Instructions (Signed)

## 2022-04-04 NOTE — Progress Notes (Signed)
Subjective:    Patient ID: Madison Butler, female    DOB: 30-Sep-1970, 51 y.o.   MRN: [id number]   Chief Complaint: medical management of chronic issues     HPI:  Madison Butler is a 51 y.o. who identifies as a female who was assigned female at birth.   Social history: Lives with: husband and daughter Work history: Engineer, maintenance in [employer]   Comes in today for follow up of the following chronic medical issues:  1. Primary hypertension No c/o chest pain, sob or headache. Does not check blood pressure at home. BP Readings from Last 3 Encounters:  07/01/21 (!) 142/78  05/31/21 133/80  04/25/21 138/82     2. Mixed hyperlipidemia Does not watch diet very closely and does no dedicated exercise. Lab Results  Component Value Date   CHOL 100 07/01/2021   HDL 42 07/01/2021   LDLCALC 46 07/01/2021   TRIG 48 07/01/2021   CHOLHDL 2.4 07/01/2021   The ASCVD Risk score (Arnett DK, et al., 2019) failed to calculate for the following reasons:   The valid total cholesterol range is 130 to 320 mg/dL   3. Gastroesophageal reflux disease without esophagitis Is on protonix but has not had to take lately  4. Morbid obesity (HCC) Weight is up 17lbs  Wt Readings from Last 3 Encounters:  04/04/22 (!) 336 lb (152.4 kg)  07/01/21 (!) 319 lb 12.8 oz (145.1 kg)  05/31/21 (!) 326 lb (147.9 kg)   BMI Readings from Last 3 Encounters:  04/04/22 57.67 kg/m  07/01/21 54.89 kg/m  05/31/21 55.96 kg/m      New complaints: None today  Allergies  Allergen Reactions   Amoxicillin    Penicillins    Outpatient Encounter Medications as of 04/04/2022  Medication Sig   acetaminophen (TYLENOL) 325 MG tablet Take 650 mg by mouth every 6 (six) hours as needed.   albuterol (VENTOLIN HFA) 108 (90 Base) MCG/ACT inhaler Inhale 2 puffs into the lungs every 6 (six) hours as needed for wheezing or shortness of breath.   atorvastatin (LIPITOR) 40 MG tablet TAKE ONE (1) TABLET EACH DAY (NEEDS TO  BE SEEN BEFORE NEXT REFILL)   famotidine (PEPCID) 20 MG tablet One after supper   furosemide (LASIX) 40 MG tablet TAKE ONE (1) TABLET EACH DAY (NEEDS TO BE SEEN BEFORE NEXT REFILL)   mometasone-formoterol (DULERA) 100-5 MCG/ACT AERO dulera 100 Take 2 puffs first thing in am and then another 2 puffs about 12 hours later.   nabumetone (RELAFEN) 750 MG tablet TAKE ONE TABLET BY MOUTH TWICE DAILY   pantoprazole (PROTONIX) 40 MG tablet Take 1 tablet (40 mg total) by mouth daily. Take 30-60 min before first meal of the day   telmisartan (MICARDIS) 80 MG tablet Take 1 tablet (80 mg total) by mouth daily. (NEEDS TO BE SEEN BEFORE NEXT REFILL)   No facility-administered encounter medications on file as of 04/04/2022.    No past surgical history on file.  Family History  Problem Relation Age of Onset   Hypertension Mother    Kidney disease Mother       Controlled substance contract: n/a     Review of Systems  Constitutional:  Negative for diaphoresis.  Eyes:  Negative for pain.  Respiratory:  Negative for shortness of breath.   Cardiovascular:  Negative for chest pain, palpitations and leg swelling.  Gastrointestinal:  Negative for abdominal pain.  Endocrine: Negative for polydipsia.  Skin:  Negative for rash.  Neurological:  Negative for  dizziness, weakness and headaches.  Hematological:  Does not bruise/bleed easily.  All other systems reviewed and are negative.      Objective:   Physical Exam Vitals and nursing note reviewed.  Constitutional:      General: She is not in acute distress.    Appearance: Normal appearance. She is well-developed.  HENT:     Head: Normocephalic.     Right Ear: Tympanic membrane normal.     Left Ear: Tympanic membrane normal.     Nose: Nose normal.     Mouth/Throat:     Mouth: Mucous membranes are moist.  Eyes:     Pupils: Pupils are equal, round, and reactive to light.  Neck:     Vascular: No carotid bruit or JVD.  Cardiovascular:     Rate  and Rhythm: Normal rate and regular rhythm.     Heart sounds: Normal heart sounds.  Pulmonary:     Effort: Pulmonary effort is normal. No respiratory distress.     Breath sounds: Normal breath sounds. No wheezing or rales.  Chest:     Chest wall: No tenderness.  Abdominal:     General: Bowel sounds are normal. There is no distension or abdominal bruit.     Palpations: Abdomen is soft. There is no hepatomegaly, splenomegaly, mass or pulsatile mass.     Tenderness: There is no abdominal tenderness.  Musculoskeletal:        General: Normal range of motion.     Cervical back: Normal range of motion and neck supple.  Lymphadenopathy:     Cervical: No cervical adenopathy.  Skin:    General: Skin is warm and dry.  Neurological:     Mental Status: She is alert and oriented to person, place, and time.     Deep Tendon Reflexes: Reflexes are normal and symmetric.  Psychiatric:        Behavior: Behavior normal.        Thought Content: Thought content normal.        Judgment: Judgment normal.     BP 104/72   Pulse (!) 51   Temp 97.7 F (36.5 C) (Temporal)   Resp 20   Ht 5' 4" (1.626 m)   Wt (!) 336 lb (152.4 kg)   SpO2 96%   BMI 57.67 kg/m        Assessment & Plan:   Madison Butler comes in today with chief complaint of Medical Management of Chronic Issues   Diagnosis and orders addressed:  1. Primary hypertension Low sodium diet - CBC with Differential/Platelet - CMP14+EGFR - telmisartan (MICARDIS) 80 MG tablet; Take 1 tablet (80 mg total) by mouth daily. (NEEDS TO BE SEEN BEFORE NEXT REFILL)  Dispense: 90 tablet; Refill: 1 - furosemide (LASIX) 40 MG tablet; TAKE ONE (1) TABLET EACH DAY  Dispense: 90 tablet; Refill: 1  2. Mixed hyperlipidemia Low fat diet - Lipid panel - atorvastatin (LIPITOR) 40 MG tablet; TAKE ONE (1) TABLET EACH DAY (NEEDS TO BE SEEN BEFORE NEXT REFILL)  Dispense: 90 tablet; Refill: 1  3. Gastroesophageal reflux disease without esophagitis Avoid  spicy foods Do not eat 2 hours prior to bedtime  4. Morbid obesity ([place]) Discussed diet and exercise for person with BMI >25 Will recheck weight in 3-6 months    Labs pending Health Maintenance reviewed Diet and exercise encouraged  Follow up plan: 6 months   Mary-Margaret Hassell Done, FNP

## 2022-04-05 LAB — CMP14+EGFR
ALT: 17 IU/L (ref 0–32)
AST: 17 IU/L (ref 0–40)
Albumin/Globulin Ratio: 1.2 (ref 1.2–2.2)
Albumin: 3.7 g/dL — ABNORMAL LOW (ref 3.9–4.9)
Alkaline Phosphatase: 116 IU/L (ref 44–121)
BUN/Creatinine Ratio: 16 (ref 9–23)
BUN: 12 mg/dL (ref 6–24)
Bilirubin Total: 0.5 mg/dL (ref 0.0–1.2)
CO2: 25 mmol/L (ref 20–29)
Calcium: 8.9 mg/dL (ref 8.7–10.2)
Chloride: 103 mmol/L (ref 96–106)
Creatinine, Ser: 0.74 mg/dL (ref 0.57–1.00)
Globulin, Total: 3 g/dL (ref 1.5–4.5)
Glucose: 94 mg/dL (ref 70–99)
Potassium: 4.1 mmol/L (ref 3.5–5.2)
Sodium: 140 mmol/L (ref 134–144)
Total Protein: 6.7 g/dL (ref 6.0–8.5)
eGFR: 99 mL/min/{1.73_m2} (ref >59)

## 2022-04-05 LAB — CBC WITH DIFFERENTIAL/PLATELET
Basophils Absolute: 0 10*3/uL (ref 0.0–0.2)
Basos: 0 %
EOS (ABSOLUTE): 0.3 10*3/uL (ref 0.0–0.4)
Eos: 4 %
Hematocrit: 39.3 % (ref 34.0–46.6)
Hemoglobin: 12.9 g/dL (ref 11.1–15.9)
Immature Grans (Abs): 0 10*3/uL (ref 0.0–0.1)
Immature Granulocytes: 0 %
Lymphocytes Absolute: 2.8 10*3/uL (ref 0.7–3.1)
Lymphs: 34 %
MCH: 32.9 pg (ref 26.6–33.0)
MCHC: 32.8 g/dL (ref 31.5–35.7)
MCV: 100 fL — ABNORMAL HIGH (ref 79–97)
Monocytes Absolute: 0.7 10*3/uL (ref 0.1–0.9)
Monocytes: 8 %
Neutrophils Absolute: 4.4 10*3/uL (ref 1.4–7.0)
Neutrophils: 54 %
Platelets: 155 10*3/uL (ref 150–450)
RBC: 3.92 x10E6/uL (ref 3.77–5.28)
RDW: 12.5 % (ref 11.7–15.4)
WBC: 8.2 10*3/uL (ref 3.4–10.8)

## 2022-04-05 LAB — LIPID PANEL
Chol/HDL Ratio: 2.5 ratio (ref 0.0–4.4)
Cholesterol, Total: 113 mg/dL (ref 100–199)
HDL: 46 mg/dL (ref >39)
LDL Chol Calc (NIH): 51 mg/dL (ref 0–99)
Triglycerides: 79 mg/dL (ref 0–149)
VLDL Cholesterol Cal: 16 mg/dL (ref 5–40)

## 2022-04-11 DIAGNOSIS — L810 Postinflammatory hyperpigmentation: Secondary | ICD-10-CM | POA: Diagnosis not present

## 2022-04-11 DIAGNOSIS — L2089 Other atopic dermatitis: Secondary | ICD-10-CM | POA: Diagnosis not present

## 2022-06-01 ENCOUNTER — Other Ambulatory Visit: Payer: Self-pay | Admitting: Nurse Practitioner

## 2022-06-01 DIAGNOSIS — M7730 Calcaneal spur, unspecified foot: Secondary | ICD-10-CM

## 2022-06-01 DIAGNOSIS — M1710 Unilateral primary osteoarthritis, unspecified knee: Secondary | ICD-10-CM

## 2022-09-23 DIAGNOSIS — M79672 Pain in left foot: Secondary | ICD-10-CM | POA: Diagnosis not present

## 2022-09-23 DIAGNOSIS — M722 Plantar fascial fibromatosis: Secondary | ICD-10-CM | POA: Diagnosis not present

## 2022-09-23 DIAGNOSIS — M7652 Patellar tendinitis, left knee: Secondary | ICD-10-CM | POA: Diagnosis not present

## 2022-09-26 DIAGNOSIS — L810 Postinflammatory hyperpigmentation: Secondary | ICD-10-CM | POA: Diagnosis not present

## 2022-09-26 DIAGNOSIS — L2089 Other atopic dermatitis: Secondary | ICD-10-CM | POA: Diagnosis not present

## 2022-10-04 ENCOUNTER — Ambulatory Visit: Payer: BC Managed Care – PPO | Admitting: Nurse Practitioner

## 2022-10-04 ENCOUNTER — Encounter: Payer: Self-pay | Admitting: Nurse Practitioner

## 2022-10-04 VITALS — BP 115/73 | HR 76 | Temp 98.4°F | Ht 64.0 in | Wt 333.0 lb

## 2022-10-04 DIAGNOSIS — I10 Essential (primary) hypertension: Secondary | ICD-10-CM | POA: Diagnosis not present

## 2022-10-04 DIAGNOSIS — R058 Other specified cough: Secondary | ICD-10-CM

## 2022-10-04 DIAGNOSIS — E782 Mixed hyperlipidemia: Secondary | ICD-10-CM

## 2022-10-04 DIAGNOSIS — K219 Gastro-esophageal reflux disease without esophagitis: Secondary | ICD-10-CM | POA: Diagnosis not present

## 2022-10-04 MED ORDER — TELMISARTAN 80 MG PO TABS: 80.0000 mg | ORAL_TABLET | Freq: Every day | ORAL | 1 refills | Status: DC

## 2022-10-04 MED ORDER — FUROSEMIDE 40 MG PO TABS: ORAL_TABLET | ORAL | 1 refills | Status: DC

## 2022-10-04 MED ORDER — ATORVASTATIN CALCIUM 40 MG PO TABS: ORAL_TABLET | ORAL | 1 refills | Status: DC

## 2022-10-04 MED ORDER — PANTOPRAZOLE SODIUM 40 MG PO TBEC: 40.0000 mg | DELAYED_RELEASE_TABLET | Freq: Every day | ORAL | 1 refills | Status: DC

## 2022-10-04 MED ORDER — FAMOTIDINE 20 MG PO TABS: ORAL_TABLET | ORAL | 1 refills | Status: DC

## 2022-10-04 NOTE — Progress Notes (Signed)
Subjective:    Patient ID: Madison Butler, female    DOB: 24-May-1971, 52 y.o.   MRN: [id number]   Chief Complaint: medical management of chronic issues     HPI:  Madison Butler is a 52 y.o. who identifies as a female who was assigned female at birth.   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 Primary hypertension No c/o chest pain, sob or headache. Does not check blood pressure at home. BP Readings from Last 3 Encounters:  04/04/22 104/72  07/01/21 (!) 142/78  05/31/21 133/80     2. Mixed hyperlipidemia Does not watch diet and does no dedicated exercise. Lab Results  Component Value Date   CHOL 113 04/04/2022   HDL 46 04/04/2022   LDLCALC 51 04/04/2022   TRIG 79 04/04/2022   CHOLHDL 2.5 04/04/2022     3. Gastroesophageal reflux disease without esophagitis Is on combination pf protonix and pepcid daily and is doing well.  4. Upper airway cough symdrome vs cough variant asthma Is on dulera and uses albuterol as needed  5.  Morbid obesity ([place]) No recent weight changes Wt Readings from Last 3 Encounters:  10/04/22 (!) 333 lb (151 kg)  04/04/22 (!) 336 lb (152.4 kg)  07/01/21 (!) 319 lb 12.8 oz (145.1 kg)   BMI Readings from Last 3 Encounters:  10/04/22 57.16 kg/m  04/04/22 57.67 kg/m  07/01/21 54.89 kg/m     New complaints: None today  Allergies  Allergen Reactions   Amoxicillin    Penicillins    Outpatient Encounter Medications as of 10/04/2022  Medication Sig   acetaminophen (TYLENOL) 325 MG tablet Take 650 mg by mouth every 6 (six) hours as needed.   albuterol (VENTOLIN HFA) 108 (90 Base) MCG/ACT inhaler Inhale 2 puffs into the lungs every 6 (six) hours as needed for wheezing or shortness of breath.   atorvastatin (LIPITOR) 40 MG tablet TAKE ONE (1) TABLET EACH DAY (NEEDS TO BE SEEN BEFORE NEXT REFILL)   famotidine (PEPCID) 20 MG tablet One  after supper   furosemide (LASIX) 40 MG tablet TAKE ONE (1) TABLET EACH DAY   mometasone-formoterol (DULERA) 100-5 MCG/ACT AERO dulera 100 Take 2 puffs first thing in am and then another 2 puffs about 12 hours later.   nabumetone (RELAFEN) 750 MG tablet TAKE ONE TABLET BY MOUTH TWICE DAILY   pantoprazole (PROTONIX) 40 MG tablet Take 1 tablet (40 mg total) by mouth daily. Take 30-60 min before first meal of the day   telmisartan (MICARDIS) 80 MG tablet Take 1 tablet (80 mg total) by mouth daily. (NEEDS TO BE SEEN BEFORE NEXT REFILL)   No facility-administered encounter medications on file as of 10/04/2022.    No past surgical history on file.  Family History  Problem Relation Age of Onset   Hypertension Mother    Kidney disease Mother       Controlled substance contract: n/a     Review of Systems  Constitutional:  Negative for diaphoresis.  Eyes:  Negative for pain.  Respiratory:  Negative for shortness of breath.   Cardiovascular:  Negative for chest pain, palpitations and leg swelling.  Gastrointestinal:  Negative for abdominal pain.  Endocrine: Negative for polydipsia.  Skin:  Negative for rash.  Neurological:  Negative for dizziness, weakness and headaches.  Hematological:  Does not bruise/bleed easily.  All other systems reviewed and are negative.  Objective:   Physical Exam Vitals and nursing note reviewed.  Constitutional:      General: She is not in acute distress.    Appearance: Normal appearance. She is well-developed.  HENT:     Head: Normocephalic.     Right Ear: Tympanic membrane normal.     Left Ear: Tympanic membrane normal.     Nose: Nose normal.     Mouth/Throat:     Mouth: Mucous membranes are moist.  Eyes:     Pupils: Pupils are equal, round, and reactive to light.  Neck:     Vascular: No carotid bruit or JVD.  Cardiovascular:     Rate and Rhythm: Normal rate and regular rhythm.     Heart sounds: Normal heart sounds.  Pulmonary:      Effort: Pulmonary effort is normal. No respiratory distress.     Breath sounds: Normal breath sounds. No wheezing or rales.  Chest:     Chest wall: No tenderness.  Abdominal:     General: Bowel sounds are normal. There is no distension or abdominal bruit.     Palpations: Abdomen is soft. There is no hepatomegaly, splenomegaly, mass or pulsatile mass.     Tenderness: There is no abdominal tenderness.  Musculoskeletal:        General: Normal range of motion.     Cervical back: Normal range of motion and neck supple.  Lymphadenopathy:     Cervical: No cervical adenopathy.  Skin:    General: Skin is warm and dry.  Neurological:     Mental Status: She is alert and oriented to person, place, and time.     Deep Tendon Reflexes: Reflexes are normal and symmetric.  Psychiatric:        Behavior: Behavior normal.        Thought Content: Thought content normal.        Judgment: Judgment normal.    BP 115/73   Pulse 76   Temp 98.4 F (36.9 C)   Ht 5\' 4"  (1.626 m)   Wt (!) 333 lb (151 kg)   SpO2 96%   BMI 57.16 kg/m         Assessment & Plan:  Vella Colquitt comes in today with chief complaint of Medical Management of Chronic Issues   Diagnosis and orders addressed:  1. Primary hypertension Low sodium diet - CBC with Differential/Platelet - CMP14+EGFR - furosemide (LASIX) 40 MG tablet; TAKE ONE (1) TABLET EACH DAY  Dispense: 90 tablet; Refill: 1 - telmisartan (MICARDIS) 80 MG tablet; Take 1 tablet (80 mg total) by mouth daily. (NEEDS TO BE SEEN BEFORE NEXT REFILL)  Dispense: 90 tablet; Refill: 1  2. Mixed hyperlipidemia Low fat diet - Lipid panel - atorvastatin (LIPITOR) 40 MG tablet; TAKE ONE (1) TABLET EACH DAY (NEEDS TO BE SEEN BEFORE NEXT REFILL)  Dispense: 90 tablet; Refill: 1  3. Gastroesophageal reflux disease without esophagitis Avoid spicy foods Do not eat 2 hours prior to bedtime - pantoprazole (PROTONIX) 40 MG tablet; Take 1 tablet (40 mg total) by mouth daily.  Take 30-60 min before first meal of the day  Dispense: 90 tablet; Refill: 1 - famotidine (PEPCID) 20 MG tablet; One after supper  Dispense: 90 tablet; Refill: 1  4. Morbid obesity ([place]) Discussed diet and exercise for person with BMI >25 Will recheck weight in 3-6 months   5. Upper airway cough syndrome vs cough variant asthma   Labs pending Health Maintenance reviewed Diet and exercise encouraged  Follow up  plan: 6 months   Mary-Margaret Hassell Done, FNP

## 2022-10-04 NOTE — Patient Instructions (Signed)

## 2022-11-25 DIAGNOSIS — M722 Plantar fascial fibromatosis: Secondary | ICD-10-CM | POA: Diagnosis not present

## 2022-11-25 DIAGNOSIS — M79671 Pain in right foot: Secondary | ICD-10-CM | POA: Diagnosis not present

## 2022-12-19 DIAGNOSIS — M79671 Pain in right foot: Secondary | ICD-10-CM | POA: Diagnosis not present

## 2022-12-19 DIAGNOSIS — M722 Plantar fascial fibromatosis: Secondary | ICD-10-CM | POA: Diagnosis not present

## 2023-02-16 DIAGNOSIS — Z1231 Encounter for screening mammogram for malignant neoplasm of breast: Secondary | ICD-10-CM | POA: Diagnosis not present

## 2023-02-16 LAB — HM MAMMOGRAPHY

## 2023-02-22 ENCOUNTER — Other Ambulatory Visit: Payer: Self-pay | Admitting: Nurse Practitioner

## 2023-02-22 DIAGNOSIS — M1710 Unilateral primary osteoarthritis, unspecified knee: Secondary | ICD-10-CM

## 2023-02-22 DIAGNOSIS — M7730 Calcaneal spur, unspecified foot: Secondary | ICD-10-CM

## 2023-03-10 ENCOUNTER — Encounter: Payer: Self-pay | Admitting: Nurse Practitioner

## 2023-04-06 ENCOUNTER — Ambulatory Visit: Payer: BC Managed Care – PPO | Admitting: Nurse Practitioner

## 2023-04-06 ENCOUNTER — Encounter: Payer: Self-pay | Admitting: Nurse Practitioner

## 2023-04-06 VITALS — BP 116/72 | HR 88 | Temp 97.4°F | Ht 64.0 in | Wt 343.0 lb

## 2023-04-06 DIAGNOSIS — Z23 Encounter for immunization: Secondary | ICD-10-CM | POA: Diagnosis not present

## 2023-04-06 DIAGNOSIS — K219 Gastro-esophageal reflux disease without esophagitis: Secondary | ICD-10-CM | POA: Diagnosis not present

## 2023-04-06 DIAGNOSIS — E782 Mixed hyperlipidemia: Secondary | ICD-10-CM

## 2023-04-06 DIAGNOSIS — I10 Essential (primary) hypertension: Secondary | ICD-10-CM | POA: Diagnosis not present

## 2023-04-06 DIAGNOSIS — Z1211 Encounter for screening for malignant neoplasm of colon: Secondary | ICD-10-CM

## 2023-04-06 LAB — CMP14+EGFR

## 2023-04-06 LAB — CBC WITH DIFFERENTIAL/PLATELET
Basophils Absolute: 0 10*3/uL (ref 0.0–0.2)
Basos: 1 %
EOS (ABSOLUTE): 0.2 10*3/uL (ref 0.0–0.4)
Eos: 3 %
Hematocrit: 39.2 % (ref 34.0–46.6)
Hemoglobin: 12.9 g/dL (ref 11.1–15.9)
Immature Grans (Abs): 0 10*3/uL (ref 0.0–0.1)
Immature Granulocytes: 0 %
Lymphocytes Absolute: 2.2 10*3/uL (ref 0.7–3.1)
Lymphs: 26 %
MCH: 33.2 pg — ABNORMAL HIGH (ref 26.6–33.0)
MCHC: 32.9 g/dL (ref 31.5–35.7)
MCV: 101 fL — ABNORMAL HIGH (ref 79–97)
Monocytes Absolute: 0.6 10*3/uL (ref 0.1–0.9)
Monocytes: 7 %
Neutrophils Absolute: 5.4 10*3/uL (ref 1.4–7.0)
Neutrophils: 63 %
Platelets: 179 10*3/uL (ref 150–450)
RBC: 3.89 x10E6/uL (ref 3.77–5.28)
RDW: 12.4 % (ref 11.7–15.4)
WBC: 8.5 10*3/uL (ref 3.4–10.8)

## 2023-04-06 MED ORDER — PANTOPRAZOLE SODIUM 40 MG PO TBEC
40.0000 mg | DELAYED_RELEASE_TABLET | Freq: Every day | ORAL | 1 refills | Status: DC
Start: 2023-04-06 — End: 2023-10-09

## 2023-04-06 MED ORDER — FUROSEMIDE 40 MG PO TABS
ORAL_TABLET | ORAL | 1 refills | Status: DC
Start: 2023-04-06 — End: 2023-10-09

## 2023-04-06 MED ORDER — TELMISARTAN 80 MG PO TABS
80.0000 mg | ORAL_TABLET | Freq: Every day | ORAL | 1 refills | Status: DC
Start: 2023-04-06 — End: 2023-08-23

## 2023-04-06 MED ORDER — ATORVASTATIN CALCIUM 40 MG PO TABS
ORAL_TABLET | ORAL | 1 refills | Status: DC
Start: 2023-04-06 — End: 2023-10-09

## 2023-04-06 NOTE — Progress Notes (Signed)
Subjective:    Patient ID: Madison Butler, female    DOB: 18-Apr-1971, 52 y.o.   MRN: [id number]   Chief Complaint: medical management of chronic issues     HPI:  Madison Butler is a 52 y.o. who identifies as a female who was assigned female at birth.   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 Primary hypertension No c/o chest pain, sob or headache. Does not check blood pressure at home. BP Readings from Last 3 Encounters:  10/04/22 115/73  04/04/22 104/72  07/01/21 (!) 142/78     2. Mixed hyperlipidemia Does not watch diet and does no exercise at all. Lab Results  Component Value Date   CHOL 113 04/04/2022   HDL 46 04/04/2022   LDLCALC 51 04/04/2022   TRIG 79 04/04/2022   CHOLHDL 2.5 04/04/2022     3. Gastroesophageal reflux disease without esophagitis Uses OTC meds as needed  4. Morbid obesity (HCC) No recent weight changes Wt Readings from Last 3 Encounters:  04/06/23 (!) 343 lb (155.6 kg)  10/04/22 (!) 333 lb (151 kg)  04/04/22 (!) 336 lb (152.4 kg)   BMI Readings from Last 3 Encounters:  04/06/23 58.88 kg/m  10/04/22 57.16 kg/m  04/04/22 57.67 kg/m     New complaints: None today  Allergies  Allergen Reactions   Amoxicillin    Penicillins    Outpatient Encounter Medications as of 04/06/2023  Medication Sig   acetaminophen (TYLENOL) 325 MG tablet Take 650 mg by mouth every 6 (six) hours as needed.   albuterol (VENTOLIN HFA) 108 (90 Base) MCG/ACT inhaler Inhale 2 puffs into the lungs every 6 (six) hours as needed for wheezing or shortness of breath.   atorvastatin (LIPITOR) 40 MG tablet TAKE ONE (1) TABLET EACH DAY (NEEDS TO BE SEEN BEFORE NEXT REFILL)   famotidine (PEPCID) 20 MG tablet One after supper   furosemide (LASIX) 40 MG tablet TAKE ONE (1) TABLET EACH DAY   mometasone-formoterol (DULERA) 100-5 MCG/ACT AERO dulera 100 Take 2 puffs first  thing in am and then another 2 puffs about 12 hours later.   nabumetone (RELAFEN) 750 MG tablet TAKE ONE TABLET BY MOUTH TWICE DAILY   pantoprazole (PROTONIX) 40 MG tablet Take 1 tablet (40 mg total) by mouth daily. Take 30-60 min before first meal of the day   telmisartan (MICARDIS) 80 MG tablet Take 1 tablet (80 mg total) by mouth daily. (NEEDS TO BE SEEN BEFORE NEXT REFILL)   No facility-administered encounter medications on file as of 04/06/2023.    No past surgical history on file.  Family History  Problem Relation Age of Onset   Hypertension Mother    Kidney disease Mother       Controlled substance contract: n/a     Review of Systems  Constitutional:  Negative for diaphoresis.  Eyes:  Negative for pain.  Respiratory:  Negative for shortness of breath.   Cardiovascular:  Negative for chest pain, palpitations and leg swelling.  Gastrointestinal:  Negative for abdominal pain.  Endocrine: Negative for polydipsia.  Skin:  Negative for rash.  Neurological:  Negative for dizziness, weakness and headaches.  Hematological:  Does not bruise/bleed easily.  All other systems reviewed and are negative.      Objective:   Physical Exam Vitals and nursing note reviewed.  Constitutional:      General: She is not in acute distress.  Appearance: Normal appearance. She is well-developed.  HENT:     Head: Normocephalic.     Right Ear: Tympanic membrane normal.     Left Ear: Tympanic membrane normal.     Nose: Nose normal.     Mouth/Throat:     Mouth: Mucous membranes are moist.  Eyes:     Pupils: Pupils are equal, round, and reactive to light.  Neck:     Vascular: No carotid bruit or JVD.  Cardiovascular:     Rate and Rhythm: Normal rate and regular rhythm.     Heart sounds: Normal heart sounds.  Pulmonary:     Effort: Pulmonary effort is normal. No respiratory distress.     Breath sounds: Normal breath sounds. No wheezing or rales.  Chest:     Chest wall: No  tenderness.  Abdominal:     General: Bowel sounds are normal. There is no distension or abdominal bruit.     Palpations: Abdomen is soft. There is no hepatomegaly, splenomegaly, mass or pulsatile mass.     Tenderness: There is no abdominal tenderness.  Musculoskeletal:        General: Normal range of motion.     Cervical back: Normal range of motion and neck supple.     Right lower leg: Edema (2+) present.     Left lower leg: Edema (2+) present.  Lymphadenopathy:     Cervical: No cervical adenopathy.  Skin:    General: Skin is warm and dry.  Neurological:     Mental Status: She is alert and oriented to person, place, and time.     Deep Tendon Reflexes: Reflexes are normal and symmetric.  Psychiatric:        Behavior: Behavior normal.        Thought Content: Thought content normal.        Judgment: Judgment normal.    BP 116/72   Pulse 88   Temp (!) 97.4 F (36.3 C) (Temporal)   Ht 5\' 4"  (1.626 m)   Wt (!) 343 lb (155.6 kg)   SpO2 98%   BMI 58.88 kg/m         Assessment & Plan:  Madison Butler comes in today with chief complaint of Pain Management   Diagnosis and orders addressed:  1. Primary hypertension Low sodium diet - CBC with Differential/Platelet - CMP14+EGFR - furosemide (LASIX) 40 MG tablet; TAKE ONE (1) TABLET EACH DAY  Dispense: 90 tablet; Refill: 1 - telmisartan (MICARDIS) 80 MG tablet; Take 1 tablet (80 mg total) by mouth daily. (NEEDS TO BE SEEN BEFORE NEXT REFILL)  Dispense: 90 tablet; Refill: 1  2. Mixed hyperlipidemia Low fat diet - Lipid panel - atorvastatin (LIPITOR) 40 MG tablet; TAKE ONE (1) TABLET EACH DAY (NEEDS TO BE SEEN BEFORE NEXT REFILL)  Dispense: 90 tablet; Refill: 1  3. Gastroesophageal reflux disease without esophagitis Avoid spicy foods Do not eat 2 hours prior to bedtime - pantoprazole (PROTONIX) 40 MG tablet; Take 1 tablet (40 mg total) by mouth daily. Take 30-60 min before first meal of the day  Dispense: 90 tablet; Refill:  1  4. Morbid obesity (HCC) Discussed diet and exercise for person with BMI >25 Will recheck weight in 3-6 months   5. Screening for colon cancer - Cologuard   Labs pending Health Maintenance reviewed Diet and exercise encouraged  Follow up plan: 6 months   Mary-Margaret Daphine Deutscher, FNP

## 2023-04-06 NOTE — Patient Instructions (Signed)
  Cologuard  Your provider has prescribed Cologuard, an easy-to-use, noninvasive test for colon cancer screening, based on the latest advances in stool DNA science.   Here's what will happen next:  1. You may receive a call or email from [facility] to confirm your mailing address and insurance information 2. Your kit will be shipped directly to you 3. You collect your stool sample in the privacy of your own home. Please follow the instructions that come with the kit 4. You return the kit via [facility] shipping or pick-up, in the same box it arrived in 5. You should receive a call with the results once they are available. If you do not receive a call, please contact our office at [phone]  Insurance Coverage  Cologuard is covered by Medicare and most major insurers Cologuard is covered by Medicare and Medicare Advantage with no co-pay or deductible for eligible patients ages 74-85. Nationwide, more than 94% of Cologuard patients have no out-of-pocket cost for screening.  Based on the [place] should be covered by most private insurers with no co-pay or deductible for eligible patients (ages 93-75; at average risk for colon cancer; without symptoms). Currently, ~74% of Cologuard patients 45-49 have had no out-of-pocket cost for screening.   Many national and regional payers have begun paying for CRC screening at 85. [employer] continues to work with payers to expand coverage and access for patients ages 29-49.   Only your healthcare insurance provider can confirm how Cologuard will be covered for you. If you have questions about coverage, you can contact your insurance company directly or ask the specialists at [employer] to do that for you. A Customer Support Specialist can be reached at [phone].    Patient Support Screening for colon cancer is very important to your good health, so if you have any questions at all, please call [employer], commercial Customer Support Specialists at [phone]. They are available 24 hours a day, 6 days a week. An instructional video is available to view online at [email or web address]   *Information and graphics obtained from [email or web address]

## 2023-04-06 NOTE — Addendum Note (Signed)
Addended by: Bennie Pierini on: 04/06/2023 08:38 AM   Modules accepted: Level of Service

## 2023-04-06 NOTE — Addendum Note (Signed)
Addended by: Ignacia Bayley on: 04/06/2023 08:42 AM   Modules accepted: Orders

## 2023-04-12 LAB — LIPID PANEL
Chol/HDL Ratio: 2.8 ratio (ref 0.0–4.4)
Cholesterol, Total: 106 mg/dL (ref 100–199)
HDL: 38 mg/dL — ABNORMAL LOW
LDL Chol Calc (NIH): 53 mg/dL (ref 0–99)
Triglycerides: 74 mg/dL (ref 0–149)
VLDL Cholesterol Cal: 15 mg/dL (ref 5–40)

## 2023-04-25 DIAGNOSIS — Z1211 Encounter for screening for malignant neoplasm of colon: Secondary | ICD-10-CM | POA: Diagnosis not present

## 2023-05-13 DIAGNOSIS — R300 Dysuria: Secondary | ICD-10-CM | POA: Diagnosis not present

## 2023-05-13 DIAGNOSIS — N898 Other specified noninflammatory disorders of vagina: Secondary | ICD-10-CM | POA: Diagnosis not present

## 2023-05-13 DIAGNOSIS — R319 Hematuria, unspecified: Secondary | ICD-10-CM | POA: Diagnosis not present

## 2023-05-13 DIAGNOSIS — R350 Frequency of micturition: Secondary | ICD-10-CM | POA: Diagnosis not present

## 2023-05-23 ENCOUNTER — Other Ambulatory Visit: Payer: Self-pay | Admitting: Nurse Practitioner

## 2023-05-23 DIAGNOSIS — M7730 Calcaneal spur, unspecified foot: Secondary | ICD-10-CM

## 2023-05-23 DIAGNOSIS — M1710 Unilateral primary osteoarthritis, unspecified knee: Secondary | ICD-10-CM

## 2023-06-05 DIAGNOSIS — M79671 Pain in right foot: Secondary | ICD-10-CM | POA: Diagnosis not present

## 2023-06-05 DIAGNOSIS — M722 Plantar fascial fibromatosis: Secondary | ICD-10-CM | POA: Diagnosis not present

## 2023-08-23 ENCOUNTER — Other Ambulatory Visit: Payer: Self-pay | Admitting: Nurse Practitioner

## 2023-08-23 DIAGNOSIS — I10 Essential (primary) hypertension: Secondary | ICD-10-CM

## 2023-10-09 ENCOUNTER — Ambulatory Visit: Payer: BC Managed Care – PPO | Admitting: Nurse Practitioner

## 2023-10-09 ENCOUNTER — Encounter: Payer: Self-pay | Admitting: Nurse Practitioner

## 2023-10-09 VITALS — BP 135/83 | HR 84 | Temp 97.9°F | Ht 64.0 in | Wt 340.0 lb

## 2023-10-09 DIAGNOSIS — E782 Mixed hyperlipidemia: Secondary | ICD-10-CM

## 2023-10-09 DIAGNOSIS — I10 Essential (primary) hypertension: Secondary | ICD-10-CM | POA: Diagnosis not present

## 2023-10-09 DIAGNOSIS — K219 Gastro-esophageal reflux disease without esophagitis: Secondary | ICD-10-CM

## 2023-10-09 LAB — CBC WITH DIFFERENTIAL/PLATELET
Basophils Absolute: 0 10*3/uL (ref 0.0–0.2)
Basos: 1 %
EOS (ABSOLUTE): 0.4 10*3/uL (ref 0.0–0.4)
Eos: 5 %
Hematocrit: 40.1 % (ref 34.0–46.6)
Hemoglobin: 13 g/dL (ref 11.1–15.9)
Immature Grans (Abs): 0 10*3/uL (ref 0.0–0.1)
Immature Granulocytes: 0 %
Lymphocytes Absolute: 2.3 10*3/uL (ref 0.7–3.1)
Lymphs: 26 %
MCH: 32.9 pg (ref 26.6–33.0)
MCHC: 32.4 g/dL (ref 31.5–35.7)
MCV: 102 fL — ABNORMAL HIGH (ref 79–97)
Monocytes Absolute: 0.6 10*3/uL (ref 0.1–0.9)
Monocytes: 7 %
Neutrophils Absolute: 5.5 10*3/uL (ref 1.4–7.0)
Neutrophils: 61 %
Platelets: 171 10*3/uL (ref 150–450)
RBC: 3.95 x10E6/uL (ref 3.77–5.28)
RDW: 11.9 % (ref 11.7–15.4)
WBC: 8.9 10*3/uL (ref 3.4–10.8)

## 2023-10-09 LAB — CMP14+EGFR
ALT: 14 [IU]/L (ref 0–32)
AST: 11 [IU]/L (ref 0–40)
Albumin: 4 g/dL (ref 3.8–4.9)
Alkaline Phosphatase: 165 [IU]/L — ABNORMAL HIGH (ref 44–121)
BUN/Creatinine Ratio: 26 — ABNORMAL HIGH (ref 9–23)
BUN: 19 mg/dL (ref 6–24)
Bilirubin Total: 0.6 mg/dL (ref 0.0–1.2)
CO2: 24 mmol/L (ref 20–29)
Calcium: 9.5 mg/dL (ref 8.7–10.2)
Chloride: 104 mmol/L (ref 96–106)
Creatinine, Ser: 0.72 mg/dL (ref 0.57–1.00)
Globulin, Total: 2.8 g/dL (ref 1.5–4.5)
Glucose: 96 mg/dL (ref 70–99)
Potassium: 4.8 mmol/L (ref 3.5–5.2)
Sodium: 141 mmol/L (ref 134–144)
Total Protein: 6.8 g/dL (ref 6.0–8.5)
eGFR: 101 mL/min/{1.73_m2} (ref >59)

## 2023-10-09 LAB — LIPID PANEL
Chol/HDL Ratio: 2.8 {ratio} (ref 0.0–4.4)
Cholesterol, Total: 104 mg/dL (ref 100–199)
HDL: 37 mg/dL — ABNORMAL LOW (ref >39)
LDL Chol Calc (NIH): 53 mg/dL (ref 0–99)
Triglycerides: 65 mg/dL (ref 0–149)
VLDL Cholesterol Cal: 14 mg/dL (ref 5–40)

## 2023-10-09 MED ORDER — FAMOTIDINE 20 MG PO TABS
ORAL_TABLET | ORAL | 1 refills | Status: DC
Start: 2023-10-09 — End: 2024-04-09

## 2023-10-09 MED ORDER — PANTOPRAZOLE SODIUM 40 MG PO TBEC: 40.0000 mg | DELAYED_RELEASE_TABLET | Freq: Every day | ORAL | 1 refills | Status: DC

## 2023-10-09 MED ORDER — ATORVASTATIN CALCIUM 40 MG PO TABS: ORAL_TABLET | ORAL | 1 refills | Status: DC

## 2023-10-09 MED ORDER — FUROSEMIDE 40 MG PO TABS: ORAL_TABLET | ORAL | 1 refills | Status: DC

## 2023-10-09 MED ORDER — TELMISARTAN 80 MG PO TABS
80.0000 mg | ORAL_TABLET | Freq: Every day | ORAL | 1 refills | Status: DC
Start: 2023-10-09 — End: 2024-04-09

## 2023-10-09 NOTE — Patient Instructions (Signed)
Exercising to Stay Healthy To become healthy and stay healthy, it is recommended that you do moderate-intensity and vigorous-intensity exercise. You can tell that you are exercising at a moderate intensity if your heart starts beating faster and you start breathing faster but can still hold a conversation. You can tell that you are exercising at a vigorous intensity if you are breathing much harder and faster and cannot hold a conversation while exercising. How can exercise benefit me? Exercising regularly is important. It has many health benefits, such as: Improving overall fitness, flexibility, and endurance. Increasing bone density. Helping with weight control. Decreasing body fat. Increasing muscle strength and endurance. Reducing stress and tension, anxiety, depression, or anger. Improving overall health. What guidelines should I follow while exercising? Before you start a new exercise program, talk with your health care provider. Do not exercise so much that you hurt yourself, feel dizzy, or get very short of breath. Wear comfortable clothes and wear shoes with good support. Drink plenty of water while you exercise to prevent dehydration or heat stroke. Work out until your breathing and your heartbeat get faster (moderate intensity). How often should I exercise? Choose an activity that you enjoy, and set realistic goals. Your health care provider can help you make an activity plan that is individually designed and works best for you. Exercise regularly as told by your health care provider. This may include: Doing strength training two times a week, such as: Lifting weights. Using resistance bands. Push-ups. Sit-ups. Yoga. Doing a certain intensity of exercise for a given amount of time. Choose from these options: A total of 150 minutes of moderate-intensity exercise every week. A total of 75 minutes of vigorous-intensity exercise every week. A mix of moderate-intensity and  vigorous-intensity exercise every week. Children, pregnant women, people who have not exercised regularly, people who are overweight, and older adults may need to talk with a health care provider about what activities are safe to perform. If you have a medical condition, be sure to talk with your health care provider before you start a new exercise program. What are some exercise ideas? Moderate-intensity exercise ideas include: Walking 1 mile (1.6 km) in about 15 minutes. Biking. Hiking. Golfing. Dancing. Water aerobics. Vigorous-intensity exercise ideas include: Walking 4.5 miles (7.2 km) or more in about 1 hour. Jogging or running 5 miles (8 km) in about 1 hour. Biking 10 miles (16.1 km) or more in about 1 hour. Lap swimming. Roller-skating or in-line skating. Cross-country skiing. Vigorous competitive sports, such as football, basketball, and soccer. Jumping rope. Aerobic dancing. What are some everyday activities that can help me get exercise? Yard work, such as: Child psychotherapist. Raking and bagging leaves. Washing your car. Pushing a stroller. Shoveling snow. Gardening. Washing windows or floors. How can I be more active in my day-to-day activities? Use stairs instead of an elevator. Take a walk during your lunch break. If you drive, park your car farther away from your work or school. If you take public transportation, get off one stop early and walk the rest of the way. Stand up or walk around during all of your indoor phone calls. Get up, stretch, and walk around every 30 minutes throughout the day. Enjoy exercise with a friend. Support to continue exercising will help you keep a regular routine of activity. Where to find more information You can find more information about exercising to stay healthy from: U.S. Department of Health and Human Services: ThisPath.fi Centers for Disease Control and Prevention (  CDC): [employer].com.br Summary Exercising regularly is  important. It will improve your overall fitness, flexibility, and endurance. Regular exercise will also improve your overall health. It can help you control your weight, reduce stress, and improve your bone density. Do not exercise so much that you hurt yourself, feel dizzy, or get very short of breath. Before you start a new exercise program, talk with your health care provider. This information is not intended to replace advice given to you by your health care provider. Make sure you discuss any questions you have with your health care provider. Document Revised: 12/25/2020 Document Reviewed: 12/25/2020 Elsevier Patient Education  2024 [employer].

## 2023-10-09 NOTE — Progress Notes (Signed)
Subjective:    Patient ID: Madison Butler, female    DOB: 1970-12-21, 53 y.o.   MRN: [id number]   Chief Complaint: medical management of chronic issues     HPI:  Madison Butler is a 53 y.o. who identifies as a female who was assigned female at birth.   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 Primary hypertension No c/o chest pain, sob or headache. Does not check blood pressure at home. BP Readings from Last 3 Encounters:  04/06/23 116/72  10/04/22 115/73  04/04/22 104/72     2. Mixed hyperlipidemia Does not watch diet and does no exercise at all. Lab Results  Component Value Date   CHOL 106 04/06/2023   HDL 38 (L) 04/06/2023   LDLCALC 53 04/06/2023   TRIG 74 04/06/2023   CHOLHDL 2.8 04/06/2023   The ASCVD Risk score (Arnett DK, et al., 2019) failed to calculate for the following reasons:   The valid total cholesterol range is 130 to 320 mg/dL   3. Gastroesophageal reflux disease without esophagitis Uses OTC meds as needed  4. Morbid obesity (HCC) No recent weight changes  Wt Readings from Last 3 Encounters:  10/09/23 (!) 340 lb (154.2 kg)  04/06/23 (!) 343 lb (155.6 kg)  10/04/22 (!) 333 lb (151 kg)   BMI Readings from Last 3 Encounters:  10/09/23 58.36 kg/m  04/06/23 58.88 kg/m  10/04/22 57.16 kg/m      New complaints: None today  Allergies  Allergen Reactions   Amoxicillin    Penicillins    Outpatient Encounter Medications as of 10/09/2023  Medication Sig   acetaminophen (TYLENOL) 325 MG tablet Take 650 mg by mouth every 6 (six) hours as needed.   albuterol (VENTOLIN HFA) 108 (90 Base) MCG/ACT inhaler Inhale 2 puffs into the lungs every 6 (six) hours as needed for wheezing or shortness of breath.   atorvastatin (LIPITOR) 40 MG tablet TAKE ONE (1) TABLET EACH DAY (NEEDS TO BE SEEN BEFORE NEXT REFILL)   famotidine (PEPCID) 20 MG tablet One  after supper   furosemide (LASIX) 40 MG tablet TAKE ONE (1) TABLET EACH DAY   mometasone-formoterol (DULERA) 100-5 MCG/ACT AERO dulera 100 Take 2 puffs first thing in am and then another 2 puffs about 12 hours later.   nabumetone (RELAFEN) 750 MG tablet TAKE ONE TABLET BY MOUTH TWICE DAILY   pantoprazole (PROTONIX) 40 MG tablet Take 1 tablet (40 mg total) by mouth daily. Take 30-60 min before first meal of the day   telmisartan (MICARDIS) 80 MG tablet TAKE ONE (1) TABLET BY MOUTH EVERY DAY   No facility-administered encounter medications on file as of 10/09/2023.    No past surgical history on file.  Family History  Problem Relation Age of Onset   Hypertension Mother    Kidney disease Mother       Controlled substance contract: n/a     Review of Systems  Constitutional:  Negative for diaphoresis.  Eyes:  Negative for pain.  Respiratory:  Negative for shortness of breath.   Cardiovascular:  Negative for chest pain, palpitations and leg swelling.  Gastrointestinal:  Negative for abdominal pain.  Endocrine: Negative for polydipsia.  Skin:  Negative for rash.  Neurological:  Negative for dizziness, weakness and headaches.  Hematological:  Does not bruise/bleed easily.  All other systems reviewed and are negative.      Objective:  Physical Exam Vitals and nursing note reviewed.  Constitutional:      General: She is not in acute distress.    Appearance: Normal appearance. She is well-developed.  HENT:     Head: Normocephalic.     Right Ear: Tympanic membrane normal.     Left Ear: Tympanic membrane normal.     Nose: Nose normal.     Mouth/Throat:     Mouth: Mucous membranes are moist.  Eyes:     Pupils: Pupils are equal, round, and reactive to light.  Neck:     Vascular: No carotid bruit or JVD.  Cardiovascular:     Rate and Rhythm: Normal rate and regular rhythm.     Heart sounds: Normal heart sounds.  Pulmonary:     Effort: Pulmonary effort is normal. No  respiratory distress.     Breath sounds: Normal breath sounds. No wheezing or rales.  Chest:     Chest wall: No tenderness.  Abdominal:     General: Bowel sounds are normal. There is no distension or abdominal bruit.     Palpations: Abdomen is soft. There is no hepatomegaly, splenomegaly, mass or pulsatile mass.     Tenderness: There is no abdominal tenderness.  Musculoskeletal:        General: Normal range of motion.     Cervical back: Normal range of motion and neck supple.     Right lower leg: Edema (2+) present.     Left lower leg: Edema (2+) present.  Lymphadenopathy:     Cervical: No cervical adenopathy.  Skin:    General: Skin is warm and dry.  Neurological:     Mental Status: She is alert and oriented to person, place, and time.     Deep Tendon Reflexes: Reflexes are normal and symmetric.  Psychiatric:        Behavior: Behavior normal.        Thought Content: Thought content normal.        Judgment: Judgment normal.    BP 135/83   Pulse 84   Temp 97.9 F (36.6 C) (Temporal)   Ht 5\' 4"  (1.626 m)   Wt (!) 340 lb (154.2 kg)   SpO2 97%   BMI 58.36 kg/m          Assessment & Plan:  Madison Butler comes in today with chief complaint of No chief complaint on file.   Diagnosis and orders addressed:  1. Primary hypertension Low sodium diet - CBC with Differential/Platelet - CMP14+EGFR - furosemide (LASIX) 40 MG tablet; TAKE ONE (1) TABLET EACH DAY  Dispense: 90 tablet; Refill: 1 - telmisartan (MICARDIS) 80 MG tablet; Take 1 tablet (80 mg total) by mouth daily. (NEEDS TO BE SEEN BEFORE NEXT REFILL)  Dispense: 90 tablet; Refill: 1  2. Mixed hyperlipidemia Low fat diet - Lipid panel - atorvastatin (LIPITOR) 40 MG tablet; TAKE ONE (1) TABLET EACH DAY (NEEDS TO BE SEEN BEFORE NEXT REFILL)  Dispense: 90 tablet; Refill: 1  3. Gastroesophageal reflux disease without esophagitis Avoid spicy foods Do not eat 2 hours prior to bedtime - pantoprazole (PROTONIX) 40 MG  tablet; Take 1 tablet (40 mg total) by mouth daily. Take 30-60 min before first meal of the day  Dispense: 90 tablet; Refill: 1  4. Morbid obesity (HCC) Discussed diet and exercise for person with BMI >25 Will recheck weight in 3-6 months     Labs pending Health Maintenance reviewed Diet and exercise encouraged  Follow up plan: 6 months  Mary-Margaret Daphine Deutscher, FNP

## 2023-10-09 NOTE — Addendum Note (Signed)
Addended by: Bennie Pierini on: 10/09/2023 08:20 AM   Modules accepted: Orders

## 2023-11-21 ENCOUNTER — Other Ambulatory Visit: Payer: Self-pay | Admitting: Nurse Practitioner

## 2023-11-21 DIAGNOSIS — M1710 Unilateral primary osteoarthritis, unspecified knee: Secondary | ICD-10-CM

## 2023-11-21 DIAGNOSIS — M7730 Calcaneal spur, unspecified foot: Secondary | ICD-10-CM

## 2024-02-07 ENCOUNTER — Other Ambulatory Visit: Payer: Self-pay | Admitting: Nurse Practitioner

## 2024-02-07 DIAGNOSIS — E782 Mixed hyperlipidemia: Secondary | ICD-10-CM

## 2024-04-09 ENCOUNTER — Ambulatory Visit: Payer: BC Managed Care – PPO | Admitting: Nurse Practitioner

## 2024-04-09 ENCOUNTER — Encounter: Payer: Self-pay | Admitting: Nurse Practitioner

## 2024-04-09 VITALS — BP 139/89 | HR 85 | Temp 96.8°F | Ht 64.0 in | Wt 351.0 lb

## 2024-04-09 DIAGNOSIS — I10 Essential (primary) hypertension: Secondary | ICD-10-CM

## 2024-04-09 DIAGNOSIS — Z23 Encounter for immunization: Secondary | ICD-10-CM | POA: Diagnosis not present

## 2024-04-09 DIAGNOSIS — M1710 Unilateral primary osteoarthritis, unspecified knee: Secondary | ICD-10-CM

## 2024-04-09 DIAGNOSIS — K219 Gastro-esophageal reflux disease without esophagitis: Secondary | ICD-10-CM | POA: Diagnosis not present

## 2024-04-09 DIAGNOSIS — M7730 Calcaneal spur, unspecified foot: Secondary | ICD-10-CM

## 2024-04-09 DIAGNOSIS — Z0000 Encounter for general adult medical examination without abnormal findings: Secondary | ICD-10-CM | POA: Diagnosis not present

## 2024-04-09 DIAGNOSIS — E782 Mixed hyperlipidemia: Secondary | ICD-10-CM

## 2024-04-09 DIAGNOSIS — Z0001 Encounter for general adult medical examination with abnormal findings: Secondary | ICD-10-CM

## 2024-04-09 DIAGNOSIS — Z1329 Encounter for screening for other suspected endocrine disorder: Secondary | ICD-10-CM | POA: Diagnosis not present

## 2024-04-09 MED ORDER — ATORVASTATIN CALCIUM 40 MG PO TABS: 40.0000 mg | ORAL_TABLET | Freq: Every day | ORAL | 1 refills | Status: AC

## 2024-04-09 MED ORDER — PANTOPRAZOLE SODIUM 40 MG PO TBEC: 40.0000 mg | DELAYED_RELEASE_TABLET | Freq: Every day | ORAL | 1 refills | Status: AC

## 2024-04-09 MED ORDER — TELMISARTAN 80 MG PO TABS
80.0000 mg | ORAL_TABLET | Freq: Every day | ORAL | 1 refills | Status: AC
Start: 2024-04-09 — End: ?

## 2024-04-09 MED ORDER — FUROSEMIDE 40 MG PO TABS: ORAL_TABLET | ORAL | 1 refills | Status: AC

## 2024-04-09 MED ORDER — FAMOTIDINE 20 MG PO TABS: ORAL_TABLET | ORAL | 1 refills | Status: AC

## 2024-04-09 MED ORDER — NABUMETONE 750 MG PO TABS
750.0000 mg | ORAL_TABLET | Freq: Two times a day (BID) | ORAL | 1 refills | Status: AC
Start: 2024-04-09 — End: ?

## 2024-04-09 NOTE — Progress Notes (Signed)
 Subjective:    Patient ID: Madison Butler, female    DOB: Jan 15, 1971, 53 y.o.   MRN: [id number]   Chief Complaint: annual physical    HPI:  Madison Butler is a 53 y.o. who identifies as a female who was assigned female at birth.   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 Primary hypertension No c/o chest pain, sob or headache. Does not check blood pressure at home. BP Readings from Last 3 Encounters:  10/09/23 135/83  04/06/23 116/72  10/04/22 115/73     2. Mixed hyperlipidemia Does not watch diet and does no exercise at all. Lab Results  Component Value Date   CHOL 104 10/09/2023   HDL 37 (L) 10/09/2023   LDLCALC 53 10/09/2023   TRIG 65 10/09/2023   CHOLHDL 2.8 10/09/2023   The ASCVD Risk score (Arnett DK, et al., 2019) failed to calculate for the following reasons:   The valid total cholesterol range is 130 to 320 mg/dL   3. Gastroesophageal reflux disease without esophagitis Uses OTC meds as needed  4. Morbid obesity (HCC) Weight is up 11lbs  Wt Readings from Last 3 Encounters:  04/09/24 (!) 351 lb (159.2 kg)  10/09/23 (!) 340 lb (154.2 kg)  04/06/23 (!) 343 lb (155.6 kg)   BMI Readings from Last 3 Encounters:  04/09/24 60.25 kg/m  10/09/23 58.36 kg/m  04/06/23 58.88 kg/m        New complaints: Has had IUD in for 7 years- LMP was when she had IUD put in.  Allergies  Allergen Reactions   Amoxicillin    Penicillins    Outpatient Encounter Medications as of 04/09/2024  Medication Sig   acetaminophen (TYLENOL) 325 MG tablet Take 650 mg by mouth every 6 (six) hours as needed.   albuterol  (VENTOLIN  HFA) 108 (90 Base) MCG/ACT inhaler Inhale 2 puffs into the lungs every 6 (six) hours as needed for wheezing or shortness of breath. (Patient not taking: Reported on 10/09/2023)   atorvastatin  (LIPITOR) 40 MG tablet TAKE ONE (1) TABLET BY MOUTH EVERY DAY    famotidine  (PEPCID ) 20 MG tablet One after supper   furosemide  (LASIX ) 40 MG tablet TAKE ONE (1) TABLET EACH DAY   mometasone-formoterol (DULERA ) 100-5 MCG/ACT AERO dulera  100 Take 2 puffs first thing in am and then another 2 puffs about 12 hours later. (Patient not taking: Reported on 10/09/2023)   nabumetone  (RELAFEN ) 750 MG tablet TAKE ONE TABLET BY MOUTH TWICE DAILY   pantoprazole  (PROTONIX ) 40 MG tablet Take 1 tablet (40 mg total) by mouth daily. Take 30-60 min before first meal of the day   telmisartan  (MICARDIS ) 80 MG tablet Take 1 tablet (80 mg total) by mouth daily.   No facility-administered encounter medications on file as of 04/09/2024.    No past surgical history on file.  Family History  Problem Relation Age of Onset   Hypertension Mother    Kidney disease Mother       Controlled substance contract: n/a     Review of Systems  Constitutional:  Negative for diaphoresis.  Eyes:  Negative for pain.  Respiratory:  Negative for shortness of breath.   Cardiovascular:  Negative for chest pain, palpitations and leg swelling.  Gastrointestinal:  Negative for abdominal pain.  Endocrine: Negative for polydipsia.  Skin:  Negative for rash.  Neurological:  Negative for dizziness, weakness and headaches.  Hematological:  Does  not bruise/bleed easily.  All other systems reviewed and are negative.      Objective:   Physical Exam Vitals and nursing note reviewed.  Constitutional:      General: She is not in acute distress.    Appearance: Normal appearance. She is well-developed.  HENT:     Head: Normocephalic.     Right Ear: Tympanic membrane normal.     Left Ear: Tympanic membrane normal.     Nose: Nose normal.     Mouth/Throat:     Mouth: Mucous membranes are moist.  Eyes:     Pupils: Pupils are equal, round, and reactive to light.  Neck:     Vascular: No carotid bruit or JVD.  Cardiovascular:     Rate and Rhythm: Normal rate and regular rhythm.     Heart  sounds: Normal heart sounds.  Pulmonary:     Effort: Pulmonary effort is normal. No respiratory distress.     Breath sounds: Normal breath sounds. No wheezing or rales.  Chest:     Chest wall: No tenderness.  Abdominal:     General: Bowel sounds are normal. There is no distension or abdominal bruit.     Palpations: Abdomen is soft. There is no hepatomegaly, splenomegaly, mass or pulsatile mass.     Tenderness: There is no abdominal tenderness.  Musculoskeletal:        General: Normal range of motion.     Cervical back: Normal range of motion and neck supple.     Right lower leg: Edema (2+) present.     Left lower leg: Edema (2+) present.  Lymphadenopathy:     Cervical: No cervical adenopathy.  Skin:    General: Skin is warm and dry.  Neurological:     Mental Status: She is alert and oriented to person, place, and time.     Deep Tendon Reflexes: Reflexes are normal and symmetric.  Psychiatric:        Behavior: Behavior normal.        Thought Content: Thought content normal.        Judgment: Judgment normal.    BP 139/89   Pulse 85   Temp (!) 96.8 F (36 C) (Temporal)   Ht 5' 4 (1.626 m)   Wt (!) 351 lb (159.2 kg)   SpO2 98%   BMI 60.25 kg/m           Assessment & Plan:  Janus Vlcek comes in today with chief complaint of annual physical  Diagnosis and orders addressed:  1. Primary hypertension Low sodium diet - CBC with Differential/Platelet - CMP14+EGFR - furosemide  (LASIX ) 40 MG tablet; TAKE ONE (1) TABLET EACH DAY  Dispense: 90 tablet; Refill: 1 - telmisartan  (MICARDIS ) 80 MG tablet; Take 1 tablet (80 mg total) by mouth daily. (NEEDS TO BE SEEN BEFORE NEXT REFILL)  Dispense: 90 tablet; Refill: 1  2. Mixed hyperlipidemia Low fat diet - Lipid panel - atorvastatin  (LIPITOR) 40 MG tablet; TAKE ONE (1) TABLET EACH DAY (NEEDS TO BE SEEN BEFORE NEXT REFILL)  Dispense: 90 tablet; Refill: 1  3. Gastroesophageal reflux disease without esophagitis Avoid spicy  foods Do not eat 2 hours prior to bedtime - pantoprazole  (PROTONIX ) 40 MG tablet; Take 1 tablet (40 mg total) by mouth daily. Take 30-60 min before first meal of the day  Dispense: 90 tablet; Refill: 1  4. Morbid obesity (HCC) Discussed diet and exercise for person with BMI >25 Will recheck weight in 3-6 months Discussed GLP1 treatment  Labs pending Health Maintenance reviewed- patient will call and schedule mammogram Diet and exercise encouraged  Follow up plan: 6 months   Mary-Margaret Gladis, FNP

## 2024-04-09 NOTE — Addendum Note (Signed)
 Addended by: VIKTORIA ALAN MATSU on: 04/09/2024 08:58 AM   Modules accepted: Orders

## 2024-04-09 NOTE — Patient Instructions (Signed)

## 2024-04-10 LAB — CMP14+EGFR
ALT: 17 IU/L (ref 0–32)
AST: 17 IU/L (ref 0–40)
Albumin: 4 g/dL (ref 3.8–4.9)
Alkaline Phosphatase: 177 IU/L — ABNORMAL HIGH (ref 44–121)
BUN/Creatinine Ratio: 22 (ref 9–23)
BUN: 15 mg/dL (ref 6–24)
Bilirubin Total: 0.8 mg/dL (ref 0.0–1.2)
CO2: 23 mmol/L (ref 20–29)
Calcium: 9.5 mg/dL (ref 8.7–10.2)
Chloride: 100 mmol/L (ref 96–106)
Creatinine, Ser: 0.68 mg/dL (ref 0.57–1.00)
Globulin, Total: 3.2 g/dL (ref 1.5–4.5)
Glucose: 88 mg/dL (ref 70–99)
Potassium: 4.6 mmol/L (ref 3.5–5.2)
Sodium: 137 mmol/L (ref 134–144)
Total Protein: 7.2 g/dL (ref 6.0–8.5)
eGFR: 105 mL/min/1.73 (ref >59)

## 2024-04-10 LAB — CBC WITH DIFFERENTIAL/PLATELET
Basophils Absolute: 0 x10E3/uL (ref 0.0–0.2)
Basos: 0 %
EOS (ABSOLUTE): 0.3 x10E3/uL (ref 0.0–0.4)
Eos: 3 %
Hematocrit: 39.7 % (ref 34.0–46.6)
Hemoglobin: 13 g/dL (ref 11.1–15.9)
Immature Grans (Abs): 0 x10E3/uL (ref 0.0–0.1)
Immature Granulocytes: 0 %
Lymphocytes Absolute: 2.5 x10E3/uL (ref 0.7–3.1)
Lymphs: 26 %
MCH: 32.7 pg (ref 26.6–33.0)
MCHC: 32.7 g/dL (ref 31.5–35.7)
MCV: 100 fL — ABNORMAL HIGH (ref 79–97)
Monocytes Absolute: 0.7 x10E3/uL (ref 0.1–0.9)
Monocytes: 7 %
Neutrophils Absolute: 6.1 x10E3/uL (ref 1.4–7.0)
Neutrophils: 64 %
Platelets: 192 x10E3/uL (ref 150–450)
RBC: 3.97 x10E6/uL (ref 3.77–5.28)
RDW: 12 % (ref 11.7–15.4)
WBC: 9.6 x10E3/uL (ref 3.4–10.8)

## 2024-04-10 LAB — LIPID PANEL
Chol/HDL Ratio: 2.6 ratio (ref 0.0–4.4)
Cholesterol, Total: 103 mg/dL (ref 100–199)
HDL: 39 mg/dL — ABNORMAL LOW (ref >39)
LDL Chol Calc (NIH): 50 mg/dL (ref 0–99)
Triglycerides: 62 mg/dL (ref 0–149)
VLDL Cholesterol Cal: 14 mg/dL (ref 5–40)

## 2024-04-10 LAB — THYROID PANEL WITH TSH
Free Thyroxine Index: 2.5 (ref 1.2–4.9)
T3 Uptake Ratio: 31 (ref 24–39)
T4, Total: 8.1 ug/dL (ref 4.5–12.0)
TSH: 4.1 u[IU]/mL (ref 0.450–4.500)

## 2024-04-10 LAB — VITAMIN D 25 HYDROXY (VIT D DEFICIENCY, FRACTURES): Vit D, 25-Hydroxy: 11.5 ng/mL — AB (ref 30.0–100.0)

## 2024-04-11 ENCOUNTER — Ambulatory Visit: Payer: Self-pay | Admitting: Nurse Practitioner

## 2024-06-01 ENCOUNTER — Emergency Department (HOSPITAL_COMMUNITY)

## 2024-06-01 ENCOUNTER — Emergency Department (HOSPITAL_COMMUNITY)
Admission: EM | Admit: 2024-06-01 | Discharge: 2024-06-01 | Disposition: A | Discharge status: Home / Self Care | Attending: Emergency Medicine | Admitting: Emergency Medicine

## 2024-06-01 ENCOUNTER — Encounter (HOSPITAL_COMMUNITY): Payer: Self-pay

## 2024-06-01 ENCOUNTER — Other Ambulatory Visit: Payer: Self-pay

## 2024-06-01 DIAGNOSIS — R0789 Other chest pain: Secondary | ICD-10-CM | POA: Insufficient documentation

## 2024-06-01 DIAGNOSIS — R079 Chest pain, unspecified: Secondary | ICD-10-CM | POA: Diagnosis not present

## 2024-06-01 DIAGNOSIS — I10 Essential (primary) hypertension: Secondary | ICD-10-CM | POA: Insufficient documentation

## 2024-06-01 LAB — CBC
HCT: 39.2 % (ref 36.0–46.0)
Hemoglobin: 12.9 g/dL (ref 12.0–15.0)
MCH: 33.2 pg (ref 26.0–34.0)
MCHC: 32.9 g/dL (ref 30.0–36.0)
MCV: 100.8 fL — ABNORMAL HIGH (ref 80.0–100.0)
Platelets: 175 K/uL (ref 150–400)
RBC: 3.89 MIL/uL (ref 3.87–5.11)
RDW: 12.6 % (ref 11.5–15.5)
WBC: 10 K/uL (ref 4.0–10.5)
nRBC: 0 % (ref 0.0–0.2)

## 2024-06-01 LAB — BASIC METABOLIC PANEL WITH GFR
Anion gap: 9 (ref 5–15)
BUN: 13 mg/dL (ref 6–20)
CO2: 24 mmol/L (ref 22–32)
Calcium: 9 mg/dL (ref 8.9–10.3)
Chloride: 106 mmol/L (ref 98–111)
Creatinine, Ser: 0.66 mg/dL (ref 0.44–1.00)
GFR, Estimated: >60 mL/min (ref >60)
Glucose, Bld: 130 mg/dL — ABNORMAL HIGH (ref 70–99)
Potassium: 4 mmol/L (ref 3.5–5.1)
Sodium: 139 mmol/L (ref 135–145)

## 2024-06-01 LAB — TROPONIN I (HIGH SENSITIVITY)
Troponin I (High Sensitivity): 2 ng/L (ref <18)
Troponin I (High Sensitivity): 3 ng/L (ref <18)

## 2024-06-01 MED ORDER — METHOCARBAMOL 500 MG PO TABS: 500.0000 mg | ORAL_TABLET | Freq: Two times a day (BID) | ORAL | 0 refills | Status: AC

## 2024-06-01 MED ORDER — METHOCARBAMOL 500 MG PO TABS: 500.0000 mg | ORAL_TABLET | Freq: Two times a day (BID) | ORAL | 0 refills | Status: DC

## 2024-06-01 NOTE — ED Provider Notes (Signed)
 [facility] AT [facility] Provider Note   CSN: [id number] Arrival date & time: 06/01/24  1139     Patient presents with: Chest Pain   Madison Butler is a 53 y.o. female.   Patient is a 53 year old female who presents emergency department the chief complaint of pain to the left upper aspect of her chest, neck and left shoulder which has been ongoing since this morning.  Patient denies any personal history of cardiac or pulmonary disease but notes that she does have a family history of this.  She denies any associated shortness of breath.  She has had no increased lower extremity edema, hemoptysis, recent long travel or surgeries.  Patient has had no recent falls or blunt chest wall trauma.  There has been no associated dizziness, lightheadedness or syncope.  She does have a history of hypertension and hyperlipidemia.  She denies any associate abdominal pain, nausea, vomiting, diarrhea.  Symptoms are not exertional or positional in nature.   Chest Pain      Prior to Admission medications   Medication Sig Start Date End Date Taking? Authorizing Provider  methocarbamol  (ROBAXIN ) 500 MG tablet Take 1 tablet (500 mg total) by mouth 2 (two) times daily. 06/01/24  Yes Daralene Lonni BIRCH, PA-C  acetaminophen (TYLENOL) 325 MG tablet Take 650 mg by mouth every 6 (six) hours as needed.    [provider]  albuterol  (VENTOLIN  HFA) 108 (90 Base) MCG/ACT inhaler Inhale 2 puffs into the lungs every 6 (six) hours as needed for wheezing or shortness of breath. 06/22/20   Zollie Lowers, MD  atorvastatin  (LIPITOR) 40 MG tablet Take 1 tablet (40 mg total) by mouth daily. 04/09/24   Gladis Mustard, FNP  famotidine  (PEPCID ) 20 MG tablet One after supper 04/09/24   Gladis Mustard, FNP  furosemide  (LASIX ) 40 MG tablet TAKE ONE (1) TABLET EACH DAY 04/09/24   Gladis, Mary-Margaret, FNP  mometasone-formoterol (DULERA ) 100-5 MCG/ACT AERO dulera  100 Take 2 puffs  first thing in am and then another 2 puffs about 12 hours later. 06/22/20   Zollie Lowers, MD  nabumetone  (RELAFEN ) 750 MG tablet Take 1 tablet (750 mg total) by mouth 2 (two) times daily. 04/09/24   Gladis Mary-Margaret, FNP  pantoprazole  (PROTONIX ) 40 MG tablet Take 1 tablet (40 mg total) by mouth daily. Take 30-60 min before first meal of the day 04/09/24   Gladis Mustard, FNP  telmisartan  (MICARDIS ) 80 MG tablet Take 1 tablet (80 mg total) by mouth daily. 04/09/24   Gladis Mary-Margaret, FNP    Allergies: Amoxicillin and Penicillins    Review of Systems  Cardiovascular:  Positive for chest pain.  All other systems reviewed and are negative.   Updated Vital Signs BP 127/72   Pulse 70   Temp 98.2 F (36.8 C) (Oral)   Resp 15   Ht 5' 3 (1.6 m)   Wt (!) 154.2 kg   SpO2 96%   BMI 60.23 kg/m   Physical Exam Vitals and nursing note reviewed.  Constitutional:      General: She is not in acute distress.    Appearance: Normal appearance. She is not ill-appearing.  HENT:     Head: Normocephalic and atraumatic.     Nose: Nose normal.     Mouth/Throat:     Mouth: Mucous membranes are moist.  Eyes:     Extraocular Movements: Extraocular movements intact.     Conjunctiva/sclera: Conjunctivae normal.     Pupils: Pupils are equal, round, and  reactive to light.  Cardiovascular:     Rate and Rhythm: Normal rate and regular rhythm.     Pulses: Normal pulses.     Heart sounds: Normal heart sounds. No murmur heard. Pulmonary:     Effort: Pulmonary effort is normal. No tachypnea.     Breath sounds: Normal breath sounds. No decreased breath sounds, wheezing, rhonchi or rales.  Chest:     Chest wall: No tenderness.  Abdominal:     General: Abdomen is flat. Bowel sounds are normal.     Palpations: Abdomen is soft. There is no mass.     Tenderness: There is no abdominal tenderness. There is no guarding.  Musculoskeletal:        General: Normal range of motion.     Cervical  back: Normal range of motion and neck supple.     Right lower leg: No edema.     Left lower leg: No edema.  Skin:    General: Skin is warm and dry.  Neurological:     General: No focal deficit present.     Mental Status: She is alert and oriented to person, place, and time. Mental status is at baseline.  Psychiatric:        Mood and Affect: Mood normal.        Behavior: Behavior normal.        Thought Content: Thought content normal.        Judgment: Judgment normal.     (all labs ordered are listed, but only abnormal results are displayed) Labs Reviewed  BASIC METABOLIC PANEL WITH GFR - Abnormal; Notable for the following components:      Result Value   Glucose, Bld 130 (*)    All other components within normal limits  CBC - Abnormal; Notable for the following components:   MCV 100.8 (*)    All other components within normal limits  TROPONIN I (HIGH SENSITIVITY)  TROPONIN I (HIGH SENSITIVITY)    EKG: EKG Interpretation Date/Time:  Saturday June 01 2024 12:00:03 EDT Ventricular Rate:  71 PR Interval:  174 QRS Duration:  87 QT Interval:  374 QTC Calculation: 407 R Axis:   58  Text Interpretation: Sinus rhythm Low voltage, precordial leads Borderline T abnormalities, anterior leads Confirmed by Cleotilde Rogue ([id number]) on 06/01/2024 12:30:02 PM  Radiology: DG Chest 2 View Result Date: 06/01/2024 CLINICAL DATA:  Chest pain radiating to left arm. EXAM: CHEST - 2 VIEW COMPARISON:  None Available. FINDINGS: Lungs are adequately inflated without focal airspace consolidation or effusion. Cardiomediastinal silhouette is normal. Prominent overlying soft tissues. Bony structures are unremarkable. IMPRESSION: No active cardiopulmonary disease. Electronically Signed   By: Toribio Agreste M.D.   On: 06/01/2024 12:55     Procedures   Medications Ordered in the ED - No data to display                                  Medical Decision Making Amount and/or Complexity of Data  Reviewed Labs: ordered. Radiology: ordered.  Risk Prescription drug management.   This patient presents to the ED for concern of chest pain differential diagnosis includes ACS, pulmonary embolus, pericarditis, myocarditis, endocarditis, aortic aneurysm or dissection, pneumonia, pneumothorax, hemothorax    Additional history obtained:  Additional history obtained from none External records from outside source obtained and reviewed including none   Lab Tests:  I Ordered, and personally interpreted labs.  The pertinent results  include: No leukocytosis, no anemia, normal kidney function, normal electrolytes, negative serial troponins   Imaging Studies ordered:  I ordered imaging studies including chest x-ray I independently visualized and interpreted imaging which showed no acute cardiopulmonary process I agree with the radiologist interpretation    Problem List / ED Course:  Patient is doing well at this time and is stable for discharge home.  Discussed with patient that all workup in the emergency department has been unremarkable.  Low suspicion for ACS at this time as patient has had negative serial troponins and unremarkable EKG.  Chest x-ray was unremarked for any signs of pneumonia, pneumothorax, hemothorax.  Low suspicion for pulmonary embolus in this patient.  Pain is reproducible with movement.  Suspect muscle spasm at this point and will treat accordingly.  Do not suspect aortic aneurysm or dissection.  Patient symptoms are not positional in nature and do not suspect pericarditis or myocarditis.  No suspicion for endocarditis.  The need for close follow-up with primary care doctor on an outpatient basis was discussed as well as strict turn precautions for any new or worsening symptoms.  Patient voiced understanding and had no additional questions.   Social Determinants of Health:  None        Final diagnoses:  Atypical chest pain    ED Discharge Orders           Ordered    methocarbamol  (ROBAXIN ) 500 MG tablet  2 times daily        06/01/24 1458               Daralene Lonni BIRCH, PA-C 06/01/24 1502    Cleotilde Rogue, MD 06/04/24 1003

## 2024-06-01 NOTE — Discharge Instructions (Signed)
 Please follow-up closely with your primary care doctor on an outpatient basis.  Return to emergency department immediately for any new or worsening symptoms.

## 2024-06-01 NOTE — ED Triage Notes (Signed)
 Pt stated that she is having chest pain the radiates into her left arm if she moves too much. Pt also stated that she has been dealing with GERD. Chest pain started around 0430

## 2024-09-11 DIAGNOSIS — Z1231 Encounter for screening mammogram for malignant neoplasm of breast: Secondary | ICD-10-CM | POA: Diagnosis not present

## 2024-09-11 LAB — HM MAMMOGRAPHY

## 2024-09-16 ENCOUNTER — Encounter: Payer: Self-pay | Admitting: Nurse Practitioner

## 2024-10-07 ENCOUNTER — Ambulatory Visit: Admitting: Nurse Practitioner

## 2024-10-07 ENCOUNTER — Telehealth: Payer: Self-pay | Admitting: Nurse Practitioner

## 2024-10-07 NOTE — Telephone Encounter (Signed)
 Called to inform patient that our office will not be opening until 10 am on 1/27.   If patient calls back, offer a later time in the day if possible

## 2024-10-08 ENCOUNTER — Ambulatory Visit: Payer: Self-pay | Admitting: Nurse Practitioner

## 2024-11-04 ENCOUNTER — Ambulatory Visit: Admitting: Nurse Practitioner
